# Patient Record
Sex: Female | Born: 1960 | Race: White | Hispanic: No | Smoking: Current every day smoker
Health system: Southern US, Community
[De-identification: ages and names within clinical notes are randomized; demographics above are authoritative.]

## PROBLEM LIST (undated history)

## (undated) DIAGNOSIS — I10 Essential (primary) hypertension: Secondary | ICD-10-CM

## (undated) DIAGNOSIS — M549 Dorsalgia, unspecified: Secondary | ICD-10-CM

## (undated) DIAGNOSIS — E785 Hyperlipidemia, unspecified: Secondary | ICD-10-CM

## (undated) DIAGNOSIS — F17200 Nicotine dependence, unspecified, uncomplicated: Secondary | ICD-10-CM

## (undated) DIAGNOSIS — G4719 Other hypersomnia: Secondary | ICD-10-CM

## (undated) DIAGNOSIS — I2510 Atherosclerotic heart disease of native coronary artery without angina pectoris: Secondary | ICD-10-CM

## (undated) DIAGNOSIS — I214 Non-ST elevation (NSTEMI) myocardial infarction: Secondary | ICD-10-CM

## (undated) DIAGNOSIS — J301 Allergic rhinitis due to pollen: Secondary | ICD-10-CM

## (undated) DIAGNOSIS — J441 Chronic obstructive pulmonary disease with (acute) exacerbation: Secondary | ICD-10-CM

## (undated) DIAGNOSIS — F419 Anxiety disorder, unspecified: Secondary | ICD-10-CM

## (undated) DIAGNOSIS — J40 Bronchitis, not specified as acute or chronic: Secondary | ICD-10-CM

## (undated) HISTORY — DX: Essential (primary) hypertension: I10

## (undated) HISTORY — DX: Other hypersomnia: G47.19

## (undated) HISTORY — PX: CARPAL TUNNEL RELEASE: SHX101

## (undated) HISTORY — DX: Anxiety disorder, unspecified: F41.9

## (undated) HISTORY — PX: TONSILLECTOMY: SHX5217

## (undated) HISTORY — DX: Atherosclerotic heart disease of native coronary artery without angina pectoris: I25.10

## (undated) HISTORY — PX: TUBAL LIGATION: SHX77

## (undated) HISTORY — PX: CHOLECYSTECTOMY: SHX55

## (undated) HISTORY — DX: Allergic rhinitis due to pollen: J30.1

## (undated) HISTORY — DX: Chronic obstructive pulmonary disease with (acute) exacerbation: J44.1

## (undated) HISTORY — DX: Non-ST elevation (NSTEMI) myocardial infarction: I21.4

## (undated) HISTORY — PX: LUNG BIOPSY: SHX232

## (undated) HISTORY — PX: CARDIAC CATHETERIZATION: SHX172

## (undated) HISTORY — DX: Hyperlipidemia, unspecified: E78.5

## (undated) HISTORY — DX: Dorsalgia, unspecified: M54.9

## (undated) HISTORY — DX: Bronchitis, not specified as acute or chronic: J40

## (undated) HISTORY — DX: Nicotine dependence, unspecified, uncomplicated: F17.200

---

## 1989-08-25 HISTORY — PX: LOBECTOMY: SHX5089

## 2003-03-30 ENCOUNTER — Emergency Department (HOSPITAL_COMMUNITY): Admission: EM | Admit: 2003-03-30 | Discharge: 2003-03-30 | Payer: Self-pay | Admitting: Emergency Medicine

## 2003-03-30 ENCOUNTER — Encounter: Payer: Self-pay | Admitting: Emergency Medicine

## 2003-10-20 ENCOUNTER — Ambulatory Visit (HOSPITAL_COMMUNITY): Admission: RE | Admit: 2003-10-20 | Discharge: 2003-10-20 | Payer: Self-pay | Admitting: Obstetrics & Gynecology

## 2003-12-08 ENCOUNTER — Ambulatory Visit (HOSPITAL_COMMUNITY): Admission: RE | Admit: 2003-12-08 | Discharge: 2003-12-08 | Payer: Self-pay | Admitting: Obstetrics & Gynecology

## 2004-07-23 ENCOUNTER — Emergency Department (HOSPITAL_COMMUNITY): Admission: EM | Admit: 2004-07-23 | Discharge: 2004-07-23 | Payer: Self-pay | Admitting: Emergency Medicine

## 2006-06-10 ENCOUNTER — Ambulatory Visit: Payer: Self-pay | Admitting: Orthopedic Surgery

## 2006-07-01 ENCOUNTER — Ambulatory Visit: Payer: Self-pay | Admitting: Orthopedic Surgery

## 2006-07-02 ENCOUNTER — Encounter (HOSPITAL_COMMUNITY): Admission: RE | Admit: 2006-07-02 | Discharge: 2006-08-04 | Payer: Self-pay | Admitting: Orthopedic Surgery

## 2006-07-22 ENCOUNTER — Ambulatory Visit: Payer: Self-pay | Admitting: Orthopedic Surgery

## 2006-08-06 ENCOUNTER — Ambulatory Visit: Payer: Self-pay | Admitting: Orthopedic Surgery

## 2006-09-03 ENCOUNTER — Ambulatory Visit: Payer: Self-pay | Admitting: Orthopedic Surgery

## 2006-12-07 ENCOUNTER — Emergency Department (HOSPITAL_COMMUNITY): Admission: EM | Admit: 2006-12-07 | Discharge: 2006-12-07 | Payer: Self-pay | Admitting: Emergency Medicine

## 2006-12-11 ENCOUNTER — Encounter (HOSPITAL_COMMUNITY): Admission: RE | Admit: 2006-12-11 | Discharge: 2007-01-10 | Payer: Self-pay | Admitting: General Surgery

## 2006-12-21 ENCOUNTER — Encounter (INDEPENDENT_AMBULATORY_CARE_PROVIDER_SITE_OTHER): Payer: Self-pay | Admitting: Specialist

## 2006-12-21 ENCOUNTER — Ambulatory Visit (HOSPITAL_COMMUNITY): Admission: RE | Admit: 2006-12-21 | Discharge: 2006-12-21 | Payer: Self-pay | Admitting: General Surgery

## 2007-04-15 ENCOUNTER — Ambulatory Visit (HOSPITAL_COMMUNITY): Admission: RE | Admit: 2007-04-15 | Discharge: 2007-04-15 | Payer: Self-pay | Admitting: Obstetrics & Gynecology

## 2007-11-27 ENCOUNTER — Ambulatory Visit: Payer: Self-pay | Admitting: Family Medicine

## 2007-12-13 ENCOUNTER — Ambulatory Visit (HOSPITAL_COMMUNITY): Admission: RE | Admit: 2007-12-13 | Discharge: 2007-12-13 | Payer: Self-pay | Admitting: Orthopaedic Surgery

## 2007-12-31 ENCOUNTER — Ambulatory Visit (HOSPITAL_COMMUNITY): Admission: RE | Admit: 2007-12-31 | Discharge: 2007-12-31 | Payer: Self-pay | Admitting: Orthopaedic Surgery

## 2008-10-13 ENCOUNTER — Other Ambulatory Visit: Admission: RE | Admit: 2008-10-13 | Discharge: 2008-10-13 | Payer: Self-pay | Admitting: Obstetrics & Gynecology

## 2008-12-21 ENCOUNTER — Ambulatory Visit (HOSPITAL_COMMUNITY): Admission: RE | Admit: 2008-12-21 | Discharge: 2008-12-21 | Payer: Self-pay | Admitting: Obstetrics & Gynecology

## 2009-02-28 ENCOUNTER — Emergency Department: Payer: Self-pay | Admitting: Emergency Medicine

## 2010-03-01 ENCOUNTER — Other Ambulatory Visit: Admission: RE | Admit: 2010-03-01 | Discharge: 2010-03-01 | Payer: Self-pay | Admitting: Obstetrics & Gynecology

## 2010-12-25 ENCOUNTER — Other Ambulatory Visit: Payer: Self-pay | Admitting: Obstetrics & Gynecology

## 2010-12-25 DIAGNOSIS — R52 Pain, unspecified: Secondary | ICD-10-CM

## 2010-12-25 DIAGNOSIS — N6320 Unspecified lump in the left breast, unspecified quadrant: Secondary | ICD-10-CM

## 2011-01-01 ENCOUNTER — Ambulatory Visit (HOSPITAL_COMMUNITY)
Admission: RE | Admit: 2011-01-01 | Discharge: 2011-01-01 | Disposition: A | Discharge status: Home / Self Care | Payer: 59 | Source: Ambulatory Visit | Attending: Obstetrics & Gynecology | Admitting: Obstetrics & Gynecology

## 2011-01-01 ENCOUNTER — Other Ambulatory Visit (HOSPITAL_COMMUNITY): Payer: Self-pay | Admitting: Obstetrics & Gynecology

## 2011-01-01 ENCOUNTER — Other Ambulatory Visit: Payer: Self-pay

## 2011-01-01 ENCOUNTER — Other Ambulatory Visit: Payer: Self-pay | Admitting: Obstetrics & Gynecology

## 2011-01-01 ENCOUNTER — Other Ambulatory Visit: Payer: Self-pay | Admitting: Diagnostic Radiology

## 2011-01-01 DIAGNOSIS — R52 Pain, unspecified: Secondary | ICD-10-CM

## 2011-01-01 DIAGNOSIS — N6320 Unspecified lump in the left breast, unspecified quadrant: Secondary | ICD-10-CM

## 2011-01-01 DIAGNOSIS — N63 Unspecified lump in unspecified breast: Secondary | ICD-10-CM | POA: Insufficient documentation

## 2011-01-01 LAB — HM MAMMOGRAPHY

## 2011-01-07 NOTE — Op Note (Signed)
[name], Laura Gillespie             ACCOUNT NO.:  [id number]   MEDICAL RECORD NO.:  [id number]          PATIENT TYPE:  AMB   LOCATION:  DAY                           FACILITY:  [facility]   PHYSICIAN:  J. Darreld Mclean, M.D. DATE OF BIRTH:  July 28, 1961   DATE OF PROCEDURE:  DATE OF DISCHARGE:                               OPERATIVE REPORT   PREOPERATIVE DIAGNOSIS:  Carpal tunnel syndrome, left.   POSTOPERATIVE DIAGNOSIS:  Carpal tunnel syndrome, left.   PROCEDURE:  Open release of volar carpal ligament, saline neurolysis and  epineurotomy, left medial nerve.   ANESTHESIA:  Bier block.  Please refer to anesthesia record for  tourniquet time.   SURGEON:  J. Darreld Mclean, MD   INDICATIONS:  The patient is a 50 year old female with bilateral carpal  tunnel syndrome.  She has already had release on the right, now she  desires release on the left.  She previously had positive EMGs, very  little response to night splinting, antiinflammatories, and rest.  She  desires to have a surgery.  She is familiar with the surgery having just  had it on the right several weeks ago.   DESCRIPTION OF PROCEDURE:  The patient was seen in the holding area.  The left wrist was identified as the correct surgical site.  She placed  a mark on the left wrist volar site and so did I.  She was brought to  the operating room and placed supine.  A Bier block anesthesia was given  on the left side.  She was then prepped and draped in the usual manner.  A time-out identified that [name] was the patient and the left  volar wrist was the correct surgical site.  A Lempert incision was made,  then the median nerve was identified approximately, and vessel loop  placed around the nerve.  A grooved director was then placed in the  carpal tunnel space and volar carpal ligament was incised.  It was  obviously tied and then was obviously compressed.  Saline neurolysis and  epineurotomy carried out.  Retinaculum cut  proximally.  Specimen of  volar carpal ligament was sent to pathology.  The nerve inspected, no  apparent injury.  Wound then reapproximated using 3-0 nylon in  interrupted vertical mattress manner.  Sterile dressing applied, a bulky  dressing applied.  Ace bandage was applied.  No splint was applied.  The  sheet cotton was cut dorsally prior to application of the Ace bandage.  The patient tolerated the procedure well.  I will see her in the office  approximately in 1 week.   She already has pain medicine from her previous surgery and she will  continue that as needed.  If any difficulty, she is to contact me  through the office or hospital beeper system.          ______________________________  Shela Commons. Darreld Mclean, M.D.    JWK/MEDQ  D:  12/31/2007  T:  12/31/2007  Job:  [id number]

## 2011-01-07 NOTE — H&P (Signed)
Laura Gillespie, Laura Gillespie             ACCOUNT NO.:  [id number]   MEDICAL RECORD NO.:  [id number]         PATIENT TYPE:  AMB   LOCATION:  DAY                           FACILITY:  [facility]   PHYSICIAN:  J. Darreld Mclean, M.D. DATE OF BIRTH:  1961/03/02   DATE OF ADMISSION:  DATE OF DISCHARGE:  LH                              HISTORY & PHYSICAL   CHIEF COMPLAINT:  Carpal tunnel syndrome.   The patient is a 49 year old female who I first saw in my office on  April 2 complaining of pain and tenderness from carpal tunnel syndrome.  She was originally diagnosed with bilateral carpal tunnel syndrome in  2006.  She was seen at [facility].  She did not want any  surgery done at that time.  Since that time her symptoms have gotten  much worse.  She is dropping things now with the hands more on the right  than on the left.  She has marked night paresthesias.  She has used a  brace, but does not help any more.  She tried anti-inflammatories which  no longer help.  She has tried rubs.  She would now like to consider  surgery.  She had EMG's done in [place], and I have copies of those  notes and have reviewed them.  She is now ready to proceed with surgery.   PAST HISTORY:  Positive for hypertension.  She denies heart disease,  lung disease, diabetes, rheumatic fever, cancer, ulcer disease,  circulatory problems.   ALLERGIES:  She has seasonal allergies, but has no allergy to any  medication.   MEDICATIONS:  She currently takes aspirin 81 mg a day, Benicar HCT  20/12.5 daily, Zyrtec 10 mg daily, Crestor 10 mg daily.   SOCIAL HISTORY:  She does not smoke.  She does not use alcoholic  beverages.   PRIMARY CARE PHYSICIAN:  Dr. Wonda Cheng is her family doctor.   PAST SURGICAL HISTORY:  Status post lung surgery June 12, 1990,  tonsillectomy 1968, tubal ligation July 2002 cervical ablation in 2005,  and cholecystectomy April 2008.   FAMILY HISTORY:  Hypertension, diabetes, and heart  disease run in the  family.  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   PHYSICAL EXAMINATION:  VITAL SIGNS:  BP is 118/82, pulse 72,  respirations 16, afebrile, 5 feet 3 inches, 254 pounds.  GENERAL:  The patient is alert, cooperative, oriented.  HEENT:  Negative.  NECK:  Supple.  LUNGS:  Clear to P&A.  HEART:  Regular without murmur.  ABDOMEN:  Soft, tender without masses.  EXTREMITIES:  She had a positive Phalen's, positive Nelson in both hands  right worse than the left.  Grip strengths are good.  CNS:  Otherwise, intact.  SKIN:  Intact.   IMPRESSION:  Bilateral carpal tunnel syndrome.   The patient desires surgery on the right hand.  I explained to her the  risks and imponderables of the procedure.  Her labs are pending.  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                                            ______________________________  Shela Commons. Darreld Mclean, M.D.     JWK/MEDQ  D:  12/10/2007  T:  12/10/2007  Job:  [id number]

## 2011-01-07 NOTE — H&P (Signed)
Laura Gillespie, Laura Gillespie             ACCOUNT NO.:  [id number]   MEDICAL RECORD NO.:  [id number]          PATIENT TYPE:  AMB   LOCATION:  DAY                           FACILITY:  [facility]   PHYSICIAN:  J. Darreld Mclean, M.D. DATE OF BIRTH:  17-Nov-1960   DATE OF ADMISSION:  DATE OF DISCHARGE:  LH                              HISTORY & PHYSICAL   CHIEF COMPLAINT:  Carpal tunnel syndrome, left.   HISTORY OF PRESENT ILLNESS:  The patient is a 50 year old female.  I did  a carpal tunnel release on the right on 12/13/2007.  She has done well.  She now desires a carpal tunnel release on the left.  She has had nerve  conduction studies in the past from __________ [facility] in 2006  showing bilateral carpal tunnel.  She wanted to delay the surgery until  recently.  She has done very well with the right, and would like to have  carpal tunnel surgery on the left.  She is familiar with the procedure  and knows the risks and imponderables.  The patient works in the blood  bank at the hospital and realizes that she cannot __________ with blood  or other such materials with the healing wound of her hand.  She has  tried antiinflammatory drugs and splints; all have been unsuccessful.   PAST HISTORY:  Positive for hypertension.   The patient has seasonal allergies, but no allergies to medicine.   The patient takes 81 mg aspirin daily, Benicar 20/12.5 daily, Zyrtec 10  daily, and Crestor.   She does not smoke or use alcohol.   Dr. Wonda Cheng is her family doctor.   She is post lung surgery in 1991, tonsillectomy in 1968, tubal ligation  2002, ablation 2005, cholecystectomy April 2008, and carpal tunnel on  the right, April 2009.   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  Hypertension and diabetes run in the family.   VITAL SIGNS:  Normal.  HEENT:  Negative.  NECK:  Supple.  LUNGS:  Clear to P&A.  HEART:  Regular without murmur heard.  ABDOMEN:  Soft and nontender without masses.  EXTREMITIES:  A well-healed scar, right hand.  Recent carpal tunnel  surgery.  She got a positive Phalen's and positive Tinel's on the left,  negative on the right.  Grip strengths are good.  Decreased sensation in  the median nerve distribution of left hand, normal on the right.  CNS:  Otherwise intact.  SKIN:  Intact.   IMPRESSION:  Carpal tunnel syndrome on left.  Recent carpal tunnel  surgery on the right, doing well.   PLAN:  Open carpal tunnel release on the left.  Labs pending.                                            ______________________________  Shela Commons. Darreld Mclean, M.D.     JWK/MEDQ  D:  12/28/2007  T:  12/29/2007  Job:  [id number] 

## 2011-01-07 NOTE — Op Note (Signed)
NAMECHEMEKA, FILICE             ACCOUNT NO.:  [id number]   MEDICAL RECORD NO.:  [id number]          PATIENT TYPE:  AMB   LOCATION:  DAY                           FACILITY:  [facility]   PHYSICIAN:  J. Darreld Mclean, M.D. DATE OF BIRTH:  May 10, 1961   DATE OF PROCEDURE:  12/13/2007  DATE OF DISCHARGE:                               OPERATIVE REPORT   PREOPERATIVE DIAGNOSIS:  Carpal tunnel syndrome on the right.   POSTOPERATIVE DIAGNOSIS:  Carpal tunnel syndrome on the right.   PROCEDURE:  Open release of volar carpal ligament, saline neurolysis and  epineurotomy, right median nerve.   ANESTHESIA:  Bier block.  Please refer to anesthesia record for  tourniquet time.   SURGEON:  J. Darreld Mclean, MD.   DRAINS:  None.   SPLINT:  None.   The patient is a 50 year old female who had nerve conduction velocity  studies and an EMG showing carpal tunnel syndrome bilaterally.  These  were done in [place] in 2006.  She did not want surgery at that time,  but her carpal tunnel pain has gotten worse.  She has night  paresthesias.  She is dropping things.  She like to have the procedure  done.  Risk and imponderables were explained preoperatively.  She  appeared to understand, asked appropriate questions.   DESCRIPTION OF PROCEDURE:  The patient was seen in the holding area.  The right wrist was identified as correct surgical site.  I placed a  mark on the volar site and she placed a mark.  She was brought back to  the operating room.  She was placed supine.  She was given Bier block  anesthesia.  She was then prepped and draped in the usual manner.  A  time-out identified Ms. Burnstein as the patient and the right wrist as  the correct surgical site on volar side.  Midline incision was made and  the wound was opened proximally and the median nerve was identified.  A  vessel loop placed around the nerve.  A grooved director were then  placed.  The volar carpal ligament was then incised.  The  nerve was  obviously compressed.  Saline neurolysis and epineurotomy carried out.  Specimen of volar carpal ligament was sent to pathology.  The wound was  approximated using 3-0 nylon in interrupted vertical mattress manner.  Prior to closure of the wound, the nerve inspected, no apparent injury.  Sterile dressing applied, a  bulky dressing applied with Webril.  Webril was cut dorsally.  Ace  bandage was applied loosely.  The patient tolerated procedure well.  She  was given a prescription for Vicodin ES for pain.  We will see in the  office in 1 week.  Any difficulty she is to contact the office or  hospital beeper system.           ______________________________  Shela Commons. Darreld Mclean, M.D.     JWK/MEDQ  D:  12/13/2007  T:  12/13/2007  Job:  [id number]

## 2011-01-08 ENCOUNTER — Ambulatory Visit (HOSPITAL_COMMUNITY): Payer: 59

## 2011-01-10 NOTE — Op Note (Signed)
Laura Gillespie, Laura Gillespie             ACCOUNT NO.:  [id number]   MEDICAL RECORD NO.:  [id number]          PATIENT TYPE:  AMB   LOCATION:  DAY                           FACILITY:  [facility]   PHYSICIAN:  Dalia Heading, M.D.  DATE OF BIRTH:  [birth year]   DATE OF PROCEDURE:  12/21/2006  DATE OF DISCHARGE:                               OPERATIVE REPORT   PREOPERATIVE DIAGNOSIS:  Chronic cholecystitis.   POSTOPERATIVE DIAGNOSIS:  1. Chronic cholecystitis.  2. Umbilical hernia.   PROCEDURE:  1. Laparoscopic cholecystectomy.  2. Umbilical herniorrhaphy.   SURGEON:  Dalia Heading, M.D.   ANESTHESIA:  General endotracheal.   INDICATIONS:  The patient is a 50 year old white female who presents  with biliary colic secondary to chronic cholecystitis.  The risks and  benefits of the procedure including bleeding, infection, hepatobiliary  injury and the possibility of an open procedure were fully explained to  the patient, who gave an informed consent.   PROCEDURE NOTE:  The patient was placed in the supine position.  After  induction of general endotracheal anesthesia, the abdomen was prepped  and draped using the usual sterile technique with Betadine.  Surgical  site confirmation was performed.   A supraumbilical incision was made down to the fascia.  During this  dissection, it was found that the patient had a supraumbilical hernia  with omentum attached to it.  It could not be reduced.  It was elected  to thus place the Veress needle just lateral to this.  The abdomen was  then insufflated to 16 mmHg pressure.  An 11-mm trocar was then  introduced into the abdominal cavity under direct visualization without  difficulty.  The patient was placed in reverse Trendelenburg position.  An an additional 11-mm trocar was placed in the epigastric region and 5-  mm trocars were placed in the right upper quadrant, and right flank  regions.  The liver was inspected and noted to be normal limits.   The  gallbladder was retracted superior and laterally.  The dissection was  begun around the infundibulum of the gallbladder.  The cystic duct was  first identified.  Its juncture to the infundibulum fully identified.  The Endoclips were placed proximally and distally on the cystic duct;  and cystic duct was divided.  This was likewise done to the cystic  artery.  The gallbladder then freed away from the gallbladder fossa  using Bovie electrocautery.  The gallbladder was delivered through the  epigastric trocar site using an EndoCatch bag without difficulty.  The  gallbladder fossa was inspected; and no abnormal bleeding or bile  leakage was noted.  Surgicel was then placed into the gallbladder fossa.  All fluid and air were then evacuated from the abdominal cavity prior to  removal of the trocars.  All wounds were irrigated with normal saline.  All wounds were injected with 0.5% Sensorcaine.   The umbilical hernia was then addressed.  The incarcerated omentum and  properitoneal fat was excised.  The contents were then reduced; and the  defect closed using #0 Vicryl interrupted sutures.  Likewise, the trocar  site opening was closed using #0 Vicryl interrupted sutures.  All skin  incisions were closed using staples.  Betadine ointment and dry sterile  dressings were applied.   All tape and needle counts were correct at the end of the procedure.  The patient was extubated in the operating room and went back to the  recovery room awake and in stable condition.   COMPLICATIONS:  None.   SPECIMEN:  Gallbladder.   BLOOD LOSS:  Minimal.      Dalia Heading, M.D.  Electronically Signed     MAJ/MEDQ  D:  12/21/2006  T:  12/21/2006  Job:  [id number]

## 2011-01-10 NOTE — H&P (Signed)
NAMETIWANDA, THREATS             ACCOUNT NO.:  [id number]   MEDICAL RECORD NO.:  [id number]          PATIENT TYPE:  AMB   LOCATION:                                FACILITY:  [facility]   PHYSICIAN:  Dalia Heading, M.D.  DATE OF BIRTH:  1960/11/06   DATE OF ADMISSION:  DATE OF DISCHARGE:  LH                              HISTORY & PHYSICAL   CHIEF COMPLAINT:  Chronic cholecystitis   HISTORY OF PRESENT ILLNESS:  Patient is a 50 year old white female who  is referred for evaluation and treatment of abdominal pain and nausea.  It has been present for sometime, but recently has worsened. She does  have reflux disease and bloating, some fatty food intolerances noted. No  fever, chills, or jaundice has been noted.   PAST MEDICAL HISTORY:  Includes extrinsic allergies.   PAST SURGICAL HISTORY:  Right lower lobe lobectomy for a calcified lymph  node.   CURRENT MEDICATIONS:  Zyrtec.   ALLERGIES:  MORPHINE.   REVIEW OF SYSTEMS:  Unremarkable.   PHYSICAL EXAMINATION:  GENERAL:  Patient is a well-developed, well-  nourished white female in no acute distress.  HEENT:  Examination reveals no scleral icterus.  LUNGS:  Clear to auscultation with equal breath sounds bilaterally.  HEART:  Examination reveals a regular, rate, and rhythm without S3, S4  murmurs.  ABDOMEN:  Soft and nondistended. She is tender in the right upper  quadrant and epigastrium to palpation. No hepatosplenomegaly, masses, or  hernias are identified.   Ultrasound of the gallbladder is negative. HIDA scan reveals chronic  cholecystitis with a low gallbladder ejection fraction and reproducible  symptoms with a fatty meal.   IMPRESSION:  Chronic cholecystitis.   PLAN:  I will have the patient scheduled for laparoscopic  cholecystectomy on 12/21/2006. The risks and benefits of the procedure  including bleeding, infection, hepatobiliary injury, the possibility of  an open procedure were fully explained to the patient, gave  informed  consent and short stay at [facility].      Dalia Heading, M.D.  Electronically Signed     MAJ/MEDQ  D:  12/15/2006  T:  12/15/2006  Job:  [id number]

## 2011-01-10 NOTE — Op Note (Signed)
NAME:  Laura Gillespie, Laura Gillespie                       ACCOUNT NO.:  [id number]   MEDICAL RECORD NO.:  [id number]                   PATIENT TYPE:  AMB   LOCATION:  DAY                                  FACILITY:  [facility]   PHYSICIAN:  Lazaro Arms, M.D.                DATE OF BIRTH:  August 16, 1961   DATE OF PROCEDURE:  12/08/2003  DATE OF DISCHARGE:                                 OPERATIVE REPORT   PREOPERATIVE DIAGNOSES:  1. Menometrorrhagia.  2. Dysmenorrhea.   POSTOPERATIVE DIAGNOSES:  1. Menometrorrhagia.  2. Dysmenorrhea.   PROCEDURE:  Hysteroscopy, D&C, endometrial ablation.   SURGEON:  Lazaro Arms, M.D.   ANESTHESIA:  General endotracheal.   FINDINGS:  The patient had a normal endometrial cavity.   DESCRIPTION OF PROCEDURE:  The patient was taken to the operating room and  placed in the supine position where she underwent general endotracheal  anesthesia.  She was placed in the dorsal lithotomy position and prepped and  draped in the usual sterile fashion.  Her bladder was drained.   A Graves speculum was placed.  Her cervix was grasped.  A paracervical block  using 0.5% Marcaine was placed bilaterally, 20 cc total.  The cervix was  dilated serially to allow passage of the hysteroscope.  The hysteroscopic  evaluation was completely normal, with no polyps and no abnormalities  whatsoever.  An endometrial ablation was performed with the ThermaChoice 3.  A total of 21 cc of D5W was required.  It was heated to 87 degrees Celsius  for a total therapy time of 10 minutes and 3 seconds.  All fluid was  recovered at the end of the procedure.   The patient tolerated the procedure well.  She experienced minimal blood  loss.  She was taken to the recovery room in good and stable condition.  All  counts were correct.  Specimens went to the lab.      ___________________________________________                                            Lazaro Arms, M.D.   LHE/MEDQ  D:  12/08/2003   T:  12/08/2003  Job:  [id number]

## 2011-05-19 ENCOUNTER — Ambulatory Visit: Payer: Self-pay

## 2011-05-20 LAB — COMPREHENSIVE METABOLIC PANEL
ALT: 20
AST: 20
Albumin: 3.9
Alkaline Phosphatase: 98
BUN: 15
CO2: 29
Calcium: 9.4
Chloride: 102
Creatinine, Ser: 0.98
GFR calc Af Amer: >60
GFR calc non Af Amer: >60
Glucose, Bld: 109 — ABNORMAL HIGH
Potassium: 4.1
Sodium: 138
Total Bilirubin: 0.4
Total Protein: 6.5

## 2011-05-20 LAB — URINALYSIS, ROUTINE W REFLEX MICROSCOPIC
Bilirubin Urine: NEGATIVE
Glucose, UA: NEGATIVE
Ketones, ur: NEGATIVE
pH: 6.5

## 2011-05-20 LAB — DIFFERENTIAL
Basophils Absolute: 0
Eosinophils Relative: 3
Lymphocytes Relative: 34
Lymphs Abs: 1.5
Monocytes Absolute: 0.5

## 2011-05-20 LAB — CBC
HCT: 31.9 — ABNORMAL LOW
Hemoglobin: 11.3 — ABNORMAL LOW
MCHC: 35.4
MCV: 83.2
Platelets: 299
RBC: 3.84 — ABNORMAL LOW
RDW: 14.2
WBC: 4.5

## 2011-05-29 ENCOUNTER — Encounter (INDEPENDENT_AMBULATORY_CARE_PROVIDER_SITE_OTHER): Payer: Self-pay | Admitting: General Surgery

## 2011-05-30 ENCOUNTER — Ambulatory Visit (INDEPENDENT_AMBULATORY_CARE_PROVIDER_SITE_OTHER): Payer: 59 | Admitting: General Surgery

## 2011-06-06 ENCOUNTER — Encounter (INDEPENDENT_AMBULATORY_CARE_PROVIDER_SITE_OTHER): Payer: Self-pay | Admitting: General Surgery

## 2011-12-22 ENCOUNTER — Other Ambulatory Visit (HOSPITAL_COMMUNITY)
Admission: RE | Admit: 2011-12-22 | Discharge: 2011-12-22 | Disposition: A | Discharge status: Home / Self Care | Payer: 59 | Source: Ambulatory Visit | Attending: Obstetrics & Gynecology | Admitting: Obstetrics & Gynecology

## 2011-12-23 ENCOUNTER — Other Ambulatory Visit: Payer: Self-pay | Admitting: Obstetrics & Gynecology

## 2011-12-23 DIAGNOSIS — Z01419 Encounter for gynecological examination (general) (routine) without abnormal findings: Secondary | ICD-10-CM | POA: Insufficient documentation

## 2013-02-21 ENCOUNTER — Other Ambulatory Visit: Payer: Self-pay

## 2013-02-21 DIAGNOSIS — R0602 Shortness of breath: Secondary | ICD-10-CM

## 2013-02-22 ENCOUNTER — Ambulatory Visit (HOSPITAL_COMMUNITY)
Admission: RE | Admit: 2013-02-22 | Discharge: 2013-02-22 | Disposition: A | Discharge status: Home / Self Care | Payer: 59 | Source: Ambulatory Visit | Attending: Pulmonary Disease | Admitting: Pulmonary Disease

## 2013-02-22 ENCOUNTER — Other Ambulatory Visit (HOSPITAL_COMMUNITY): Payer: Self-pay | Admitting: Pulmonary Disease

## 2013-02-22 DIAGNOSIS — Z9889 Other specified postprocedural states: Secondary | ICD-10-CM

## 2013-02-22 DIAGNOSIS — Z09 Encounter for follow-up examination after completed treatment for conditions other than malignant neoplasm: Secondary | ICD-10-CM | POA: Insufficient documentation

## 2013-02-23 ENCOUNTER — Encounter (HOSPITAL_COMMUNITY): Payer: 59

## 2013-05-13 ENCOUNTER — Ambulatory Visit: Payer: Self-pay | Admitting: Internal Medicine

## 2013-10-06 ENCOUNTER — Encounter (INDEPENDENT_AMBULATORY_CARE_PROVIDER_SITE_OTHER): Payer: Self-pay | Admitting: *Deleted

## 2013-11-03 ENCOUNTER — Other Ambulatory Visit (INDEPENDENT_AMBULATORY_CARE_PROVIDER_SITE_OTHER): Payer: Self-pay | Admitting: *Deleted

## 2013-11-03 DIAGNOSIS — Z1211 Encounter for screening for malignant neoplasm of colon: Secondary | ICD-10-CM

## 2013-11-07 ENCOUNTER — Encounter (INDEPENDENT_AMBULATORY_CARE_PROVIDER_SITE_OTHER): Payer: Self-pay | Admitting: *Deleted

## 2013-11-07 ENCOUNTER — Telehealth (INDEPENDENT_AMBULATORY_CARE_PROVIDER_SITE_OTHER): Payer: Self-pay | Admitting: *Deleted

## 2013-11-07 DIAGNOSIS — Z1211 Encounter for screening for malignant neoplasm of colon: Secondary | ICD-10-CM

## 2013-11-07 NOTE — Telephone Encounter (Signed)
Patient needs movi prep 

## 2013-11-08 MED ORDER — PEG-KCL-NACL-NASULF-NA ASC-C 100 G PO SOLR: 1.0000 | Freq: Once | ORAL | Status: DC

## 2013-12-13 ENCOUNTER — Telehealth (INDEPENDENT_AMBULATORY_CARE_PROVIDER_SITE_OTHER): Payer: Self-pay | Admitting: *Deleted

## 2013-12-13 NOTE — Telephone Encounter (Signed)
agree

## 2013-12-13 NOTE — Telephone Encounter (Signed)
  Procedure: tcs  Reason/Indication:  screening  Has patient had this procedure before?  no  If so, when, by whom and where?    Is there a family history of colon cancer?  no  Who?  What age when diagnosed?    Is patient diabetic?   no      Does patient have prosthetic heart valve?  no  Do you have a pacemaker?  no  Has patient ever had endocarditis? no  Has patient had joint replacement within last 12 months?  no  Does patient tend to be constipated or take laxatives? no  Is patient on Coumadin, Plavix and/or Aspirin? no  Medications: ibuprofen prn, dyazide 37.5/25 mg daily  Allergies: nkda  Medication Adjustment:   Procedure date & time: 01/12/14

## 2013-12-23 DIAGNOSIS — I2510 Atherosclerotic heart disease of native coronary artery without angina pectoris: Secondary | ICD-10-CM

## 2013-12-23 HISTORY — DX: Atherosclerotic heart disease of native coronary artery without angina pectoris: I25.10

## 2013-12-23 HISTORY — PX: CORONARY ANGIOPLASTY: SHX604

## 2014-01-02 ENCOUNTER — Encounter (HOSPITAL_COMMUNITY): Payer: Self-pay | Admitting: Pharmacy Technician

## 2014-01-06 ENCOUNTER — Ambulatory Visit: Payer: Self-pay

## 2014-01-06 ENCOUNTER — Inpatient Hospital Stay: Payer: Self-pay | Admitting: Internal Medicine

## 2014-01-06 LAB — URINALYSIS, COMPLETE
BACTERIA: NONE SEEN
Bilirubin,UR: NEGATIVE
Blood: NEGATIVE
Glucose,UR: NEGATIVE mg/dL (ref 0–75)
Ketone: NEGATIVE
LEUKOCYTE ESTERASE: NEGATIVE
NITRITE: NEGATIVE
PH: 6 (ref 4.5–8.0)
Protein: NEGATIVE
RBC, UR: NONE SEEN /HPF (ref 0–5)
Specific Gravity: 1.005 (ref 1.003–1.030)
Squamous Epithelial: 1

## 2014-01-06 LAB — COMPREHENSIVE METABOLIC PANEL
ALT: 21 U/L (ref 12–78)
Albumin: 4 g/dL (ref 3.4–5.0)
Alkaline Phosphatase: 164 U/L — ABNORMAL HIGH
Anion Gap: 5 — ABNORMAL LOW (ref 7–16)
BUN: 9 mg/dL (ref 7–18)
Bilirubin,Total: 0.2 mg/dL (ref 0.2–1.0)
CALCIUM: 9.6 mg/dL (ref 8.5–10.1)
CO2: 28 mmol/L (ref 21–32)
Chloride: 107 mmol/L (ref 98–107)
Creatinine: 0.9 mg/dL (ref 0.60–1.30)
EGFR (African American): >60
EGFR (Non-African Amer.): >60
Glucose: 90 mg/dL (ref 65–99)
OSMOLALITY: 278 (ref 275–301)
POTASSIUM: 3.9 mmol/L (ref 3.5–5.1)
SGOT(AST): 27 U/L (ref 15–37)
Sodium: 140 mmol/L (ref 136–145)
Total Protein: 7.5 g/dL (ref 6.4–8.2)

## 2014-01-06 LAB — CBC
HCT: 40.4 % (ref 35.0–47.0)
HGB: 13.4 g/dL (ref 12.0–16.0)
MCH: 28 pg (ref 26.0–34.0)
MCHC: 33.2 g/dL (ref 32.0–36.0)
MCV: 84 fL (ref 80–100)
Platelet: 296 10*3/uL (ref 150–440)
RBC: 4.8 10*6/uL (ref 3.80–5.20)
RDW: 13.7 % (ref 11.5–14.5)
WBC: 7.6 10*3/uL (ref 3.6–11.0)

## 2014-01-06 LAB — TROPONIN I
TROPONIN-I: 0.65 ng/mL — AB
Troponin-I: 0.58 ng/mL — ABNORMAL HIGH

## 2014-01-06 LAB — CK-MB
CK-MB: 2.6 ng/mL (ref 0.5–3.6)
CK-MB: 2.3 ng/mL (ref 0.5–3.6)

## 2014-01-06 LAB — APTT: ACTIVATED PTT: 33.4 s (ref 23.6–35.9)

## 2014-01-07 DIAGNOSIS — E785 Hyperlipidemia, unspecified: Secondary | ICD-10-CM

## 2014-01-07 DIAGNOSIS — I200 Unstable angina: Secondary | ICD-10-CM

## 2014-01-07 LAB — LIPID PANEL
Cholesterol: 259 mg/dL — ABNORMAL HIGH (ref 0–200)
HDL: 45 mg/dL (ref 40–60)
Ldl Cholesterol, Calc: 171 mg/dL — ABNORMAL HIGH (ref 0–100)
Triglycerides: 215 mg/dL — ABNORMAL HIGH (ref 0–200)
VLDL Cholesterol, Calc: 43 mg/dL — ABNORMAL HIGH (ref 5–40)

## 2014-01-07 LAB — CK-MB: CK-MB: 2 ng/mL (ref 0.5–3.6)

## 2014-01-07 LAB — APTT: Activated PTT: 72.2 secs — ABNORMAL HIGH (ref 23.6–35.9)

## 2014-01-07 LAB — TROPONIN I: Troponin-I: 0.5 ng/mL — ABNORMAL HIGH

## 2014-01-07 LAB — PLATELET COUNT: Platelet: 287 10*3/uL (ref 150–440)

## 2014-01-08 LAB — HEMOGLOBIN: HGB: 13.3 g/dL (ref 12.0–16.0)

## 2014-01-08 LAB — APTT
Activated PTT: 63.3 secs — ABNORMAL HIGH (ref 23.6–35.9)
Activated PTT: 63.3 secs — ABNORMAL HIGH (ref 23.6–35.9)
Activated PTT: 69.7 secs — ABNORMAL HIGH (ref 23.6–35.9)

## 2014-01-08 LAB — PLATELET COUNT: PLATELETS: 275 10*3/uL (ref 150–440)

## 2014-01-09 ENCOUNTER — Encounter: Payer: Self-pay | Admitting: Cardiovascular Disease

## 2014-01-09 DIAGNOSIS — I2510 Atherosclerotic heart disease of native coronary artery without angina pectoris: Secondary | ICD-10-CM

## 2014-01-09 DIAGNOSIS — I214 Non-ST elevation (NSTEMI) myocardial infarction: Secondary | ICD-10-CM

## 2014-01-09 DIAGNOSIS — F17200 Nicotine dependence, unspecified, uncomplicated: Secondary | ICD-10-CM

## 2014-01-09 LAB — BASIC METABOLIC PANEL
Anion Gap: 9 (ref 7–16)
BUN: 15 mg/dL (ref 7–18)
CHLORIDE: 107 mmol/L (ref 98–107)
Calcium, Total: 9.3 mg/dL (ref 8.5–10.1)
Co2: 23 mmol/L (ref 21–32)
Creatinine: 0.8 mg/dL (ref 0.60–1.30)
EGFR (African American): >60
EGFR (Non-African Amer.): >60
Glucose: 100 mg/dL — ABNORMAL HIGH (ref 65–99)
Osmolality: 278 (ref 275–301)
Potassium: 4.4 mmol/L (ref 3.5–5.1)
Sodium: 139 mmol/L (ref 136–145)

## 2014-01-09 LAB — CK TOTAL AND CKMB (NOT AT ARMC)
CK, Total: 67 U/L
CK-MB: 0.9 ng/mL (ref 0.5–3.6)

## 2014-01-09 LAB — APTT: ACTIVATED PTT: 93.3 s — AB (ref 23.6–35.9)

## 2014-01-10 LAB — BASIC METABOLIC PANEL
Anion Gap: 6 — ABNORMAL LOW (ref 7–16)
BUN: 13 mg/dL (ref 7–18)
CALCIUM: 9.2 mg/dL (ref 8.5–10.1)
CHLORIDE: 109 mmol/L — AB (ref 98–107)
Co2: 24 mmol/L (ref 21–32)
Creatinine: 0.58 mg/dL — ABNORMAL LOW (ref 0.60–1.30)
EGFR (African American): >60
EGFR (Non-African Amer.): >60
Glucose: 95 mg/dL (ref 65–99)
Osmolality: 277 (ref 275–301)
POTASSIUM: 4.6 mmol/L (ref 3.5–5.1)
SODIUM: 139 mmol/L (ref 136–145)

## 2014-01-12 ENCOUNTER — Ambulatory Visit (HOSPITAL_COMMUNITY): Admission: RE | Admit: 2014-01-12 | Payer: 59 | Source: Ambulatory Visit | Admitting: Internal Medicine

## 2014-01-12 ENCOUNTER — Telehealth: Payer: Self-pay

## 2014-01-12 ENCOUNTER — Encounter (HOSPITAL_COMMUNITY): Admission: RE | Payer: Self-pay | Source: Ambulatory Visit

## 2014-01-12 SURGERY — COLONOSCOPY
Anesthesia: Moderate Sedation

## 2014-01-12 NOTE — Telephone Encounter (Signed)
Attempted to contact pt regarding discharge from [facility] on 01/10/14. Left message for pt to call back.

## 2014-01-13 NOTE — Telephone Encounter (Signed)
Patient contacted regarding discharge from [facility] on 01/10/14.  Patient understands to follow up with Dr. Rockey Situ on 01/17/14 at 11:30 at [facility]. Patient understands discharge instructions? yes Patient understands medications and regiment? yes Patient understands to bring all medications to this visit? yes  Pt did not pick up samples after d/c, she will p/u at her ov.

## 2014-01-17 ENCOUNTER — Ambulatory Visit (INDEPENDENT_AMBULATORY_CARE_PROVIDER_SITE_OTHER): Payer: 59 | Admitting: Cardiovascular Disease

## 2014-01-17 ENCOUNTER — Encounter: Payer: Self-pay | Admitting: Cardiovascular Disease

## 2014-01-17 VITALS — BP 110/70 | HR 66 | Ht 63.0 in | Wt 236.5 lb

## 2014-01-17 DIAGNOSIS — J069 Acute upper respiratory infection, unspecified: Secondary | ICD-10-CM | POA: Insufficient documentation

## 2014-01-17 DIAGNOSIS — F17200 Nicotine dependence, unspecified, uncomplicated: Secondary | ICD-10-CM | POA: Insufficient documentation

## 2014-01-17 DIAGNOSIS — E785 Hyperlipidemia, unspecified: Secondary | ICD-10-CM | POA: Insufficient documentation

## 2014-01-17 DIAGNOSIS — I2510 Atherosclerotic heart disease of native coronary artery without angina pectoris: Secondary | ICD-10-CM | POA: Insufficient documentation

## 2014-01-17 DIAGNOSIS — R0602 Shortness of breath: Secondary | ICD-10-CM

## 2014-01-17 DIAGNOSIS — Z955 Presence of coronary angioplasty implant and graft: Secondary | ICD-10-CM

## 2014-01-17 DIAGNOSIS — I214 Non-ST elevation (NSTEMI) myocardial infarction: Secondary | ICD-10-CM | POA: Insufficient documentation

## 2014-01-17 NOTE — Assessment & Plan Note (Signed)
Doing well  following her recent stent placement. Recommended she continue on her aspirin and brilinta twice a day

## 2014-01-17 NOTE — Assessment & Plan Note (Signed)
Encouraged her to stay on the Crestor 20 mg daily. Goal LDL less than 70

## 2014-01-17 NOTE — Patient Instructions (Signed)
You are doing well. No medication changes were made.  Please call us if you have new issues that need to be addressed before your next appt.  Your physician wants you to follow-up in: 6 months.  You will receive a reminder letter in the mail two months in advance. If you don't receive a letter, please call our office to schedule the follow-up appointment.   

## 2014-01-17 NOTE — Assessment & Plan Note (Signed)
she has recovered well from her recent non-STEMI. This was secondary to severe distal RCA disease.

## 2014-01-17 NOTE — Assessment & Plan Note (Signed)
Second day of URI. Suggested medical management at this time. No need for antibiotics given that symptoms are likely secondary to viral etiology. She has sore throat, nasal congestion, cough

## 2014-01-17 NOTE — Assessment & Plan Note (Signed)
No significant disease noted other than her distal RCA.

## 2014-01-17 NOTE — Progress Notes (Signed)
Patient ID: Laura Gillespie, female    DOB: 1960/08/31, 53 y.o.   MRN: [id number]  HPI Comments: Laura Gillespie is a very pleasant 53 year old woman with long history of smoking, obesity, hyperlipidemia, strong family history of CAD who presented to the hospital 01/07/2014 with chest pain, angina, non-STEMI. She had elevated cardiac enzymes with troponin 0.65. Cardiac catheterization was performed that showed severe distal RCA disease. Bare-metal stent was placed with improvement of her symptoms  She presents today for followup and to establish care in the [facility]. Since her discharge, she denies having any significant chest pain. She has not been smoking and circumflex hospital admission. She does report contracting a upper respiratory infection. She has severe sore throat, sinus congestion, cough. She's had this for 2 days now. She does not feel that she can go back to work this week given her symptoms.  She has otherwise been active.   EKG shows normal sinus rhythm with rate 66 beats per minute, left axis deviation  Echocardiogram in the hospital was essentially normal  Cardiac catheterization showed 99% distal RCA disease, down to 0% residual disease following the stent placement      Outpatient Encounter Prescriptions as of 01/17/2014  Medication Sig  . ALPRAZolam (XANAX) 0.5 MG tablet Take 0.5 mg by mouth at bedtime.   Marland Kitchen BRILINTA 90 MG TABS tablet Take 90 mg by mouth 2 (two) times daily.   . CRESTOR 20 MG tablet Take 20 mg by mouth daily.   . metoprolol tartrate (LOPRESSOR) 25 MG tablet Take 25 mg by mouth 2 (two) times daily.   Marland Kitchen NITROSTAT 0.4 MG SL tablet Place 0.4 mg under the tongue every 5 (five) minutes as needed.   . traMADol (ULTRAM) 50 MG tablet Take 50 mg by mouth every 6 (six) hours as needed.   Marland Kitchen aspirin EC 81 MG tablet Take 1 tablet (81 mg total) by mouth daily.    Review of Systems  Constitutional: Negative.        Malaise, sore throat  HENT: Negative.    Eyes: Negative.   Respiratory: Positive for cough.   Cardiovascular: Negative.   Gastrointestinal: Negative.   Endocrine: Negative.   Musculoskeletal: Negative.   Skin: Negative.   Allergic/Immunologic: Negative.   Neurological: Negative.   Hematological: Negative.   Psychiatric/Behavioral: Negative.   All other systems reviewed and are negative.   BP 110/70  Pulse 66  Ht 5\' 3"  (1.6 m)  Wt 236 lb 8 oz (107.276 kg)  BMI 41.90 kg/m2  Physical Exam  Nursing note and vitals reviewed. Constitutional: She is oriented to person, place, and time. She appears well-developed and well-nourished.  HENT:  Head: Normocephalic.  Nose: Nose normal.  Mouth/Throat: Oropharynx is clear and moist.  Eyes: Conjunctivae are normal. Pupils are equal, round, and reactive to light.  Neck: Normal range of motion. Neck supple. No JVD present.  Cardiovascular: Normal rate, regular rhythm, S1 normal, S2 normal, normal heart sounds and intact distal pulses.  Exam reveals no gallop and no friction rub.   No murmur heard. Pulmonary/Chest: Effort normal and breath sounds normal. No respiratory distress. She has no wheezes. She has no rales. She exhibits no tenderness.  Abdominal: Soft. Bowel sounds are normal. She exhibits no distension. There is no tenderness.  Musculoskeletal: Normal range of motion. She exhibits no edema and no tenderness.  Lymphadenopathy:    She has no cervical adenopathy.  Neurological: She is alert and oriented to person, place, and time.  Coordination normal.  Skin: Skin is warm and dry. No rash noted. No erythema.  Psychiatric: She has a normal mood and affect. Her behavior is normal. Judgment and thought content normal.    Assessment and Plan

## 2014-02-08 DIAGNOSIS — Z9861 Coronary angioplasty status: Secondary | ICD-10-CM

## 2014-02-08 DIAGNOSIS — F17200 Nicotine dependence, unspecified, uncomplicated: Secondary | ICD-10-CM

## 2014-02-08 DIAGNOSIS — E785 Hyperlipidemia, unspecified: Secondary | ICD-10-CM

## 2014-02-08 DIAGNOSIS — J069 Acute upper respiratory infection, unspecified: Secondary | ICD-10-CM

## 2014-02-08 DIAGNOSIS — I214 Non-ST elevation (NSTEMI) myocardial infarction: Secondary | ICD-10-CM

## 2014-02-08 DIAGNOSIS — I2510 Atherosclerotic heart disease of native coronary artery without angina pectoris: Secondary | ICD-10-CM

## 2014-02-09 ENCOUNTER — Encounter (HOSPITAL_COMMUNITY): Payer: Self-pay | Admitting: Emergency Medicine

## 2014-02-09 ENCOUNTER — Emergency Department (HOSPITAL_COMMUNITY): Payer: 59

## 2014-02-09 ENCOUNTER — Emergency Department (HOSPITAL_COMMUNITY)
Admission: EM | Admit: 2014-02-09 | Discharge: 2014-02-09 | Disposition: A | Discharge status: Home / Self Care | Payer: 59 | Attending: Emergency Medicine | Admitting: Emergency Medicine

## 2014-02-09 DIAGNOSIS — Z79899 Other long term (current) drug therapy: Secondary | ICD-10-CM | POA: Insufficient documentation

## 2014-02-09 DIAGNOSIS — I252 Old myocardial infarction: Secondary | ICD-10-CM | POA: Insufficient documentation

## 2014-02-09 DIAGNOSIS — Z7902 Long term (current) use of antithrombotics/antiplatelets: Secondary | ICD-10-CM | POA: Insufficient documentation

## 2014-02-09 DIAGNOSIS — Z862 Personal history of diseases of the blood and blood-forming organs and certain disorders involving the immune mechanism: Secondary | ICD-10-CM | POA: Insufficient documentation

## 2014-02-09 DIAGNOSIS — Z7982 Long term (current) use of aspirin: Secondary | ICD-10-CM | POA: Insufficient documentation

## 2014-02-09 DIAGNOSIS — I2510 Atherosclerotic heart disease of native coronary artery without angina pectoris: Secondary | ICD-10-CM | POA: Insufficient documentation

## 2014-02-09 DIAGNOSIS — Z9861 Coronary angioplasty status: Secondary | ICD-10-CM | POA: Insufficient documentation

## 2014-02-09 DIAGNOSIS — I10 Essential (primary) hypertension: Secondary | ICD-10-CM | POA: Insufficient documentation

## 2014-02-09 DIAGNOSIS — Z87891 Personal history of nicotine dependence: Secondary | ICD-10-CM | POA: Insufficient documentation

## 2014-02-09 DIAGNOSIS — R079 Chest pain, unspecified: Secondary | ICD-10-CM | POA: Insufficient documentation

## 2014-02-09 DIAGNOSIS — Z8639 Personal history of other endocrine, nutritional and metabolic disease: Secondary | ICD-10-CM | POA: Insufficient documentation

## 2014-02-09 DIAGNOSIS — Z9889 Other specified postprocedural states: Secondary | ICD-10-CM | POA: Insufficient documentation

## 2014-02-09 LAB — COMPREHENSIVE METABOLIC PANEL
ALT: 19 U/L (ref 0–35)
AST: 19 U/L (ref 0–37)
Albumin: 3.5 g/dL (ref 3.5–5.2)
Alkaline Phosphatase: 126 U/L — ABNORMAL HIGH (ref 39–117)
BILIRUBIN TOTAL: 0.3 mg/dL (ref 0.3–1.2)
BUN: 13 mg/dL (ref 6–23)
CALCIUM: 9.1 mg/dL (ref 8.4–10.5)
CHLORIDE: 102 meq/L (ref 96–112)
CO2: 21 meq/L (ref 19–32)
Creatinine, Ser: 0.73 mg/dL (ref 0.50–1.10)
GFR calc Af Amer: >90 mL/min (ref >90)
GFR calc non Af Amer: >90 mL/min (ref >90)
Glucose, Bld: 144 mg/dL — ABNORMAL HIGH (ref 70–99)
Potassium: 3.9 mEq/L (ref 3.7–5.3)
SODIUM: 138 meq/L (ref 137–147)
Total Protein: 6.8 g/dL (ref 6.0–8.3)

## 2014-02-09 LAB — CBC
HCT: 36 % (ref 36.0–46.0)
Hemoglobin: 12 g/dL (ref 12.0–15.0)
MCH: 27.8 pg (ref 26.0–34.0)
MCHC: 33.3 g/dL (ref 30.0–36.0)
MCV: 83.3 fL (ref 78.0–100.0)
PLATELETS: 252 10*3/uL (ref 150–400)
RBC: 4.32 MIL/uL (ref 3.87–5.11)
RDW: 13.4 % (ref 11.5–15.5)
WBC: 6.3 10*3/uL (ref 4.0–10.5)

## 2014-02-09 LAB — PRO B NATRIURETIC PEPTIDE: Pro B Natriuretic peptide (BNP): 12.1 pg/mL (ref 0–125)

## 2014-02-09 LAB — PROTIME-INR
INR: 0.9 (ref 0.00–1.49)
PROTHROMBIN TIME: 12 s (ref 11.6–15.2)

## 2014-02-09 LAB — MAGNESIUM: Magnesium: 2 mg/dL (ref 1.5–2.5)

## 2014-02-09 LAB — TROPONIN I: Troponin I: <0.3 ng/mL (ref <0.30)

## 2014-02-09 MED ORDER — SODIUM CHLORIDE 0.9 % IV SOLN
1000.0000 mL | INTRAVENOUS | Status: DC
  Administered 2014-02-09: 1000 mL via INTRAVENOUS

## 2014-02-09 MED ORDER — ASPIRIN 81 MG PO CHEW
324.0000 mg | CHEWABLE_TABLET | Freq: Once | ORAL | Status: AC
  Administered 2014-02-09: 324 mg via ORAL
  Filled 2014-02-09: qty 4

## 2014-02-09 MED ORDER — FENTANYL CITRATE 0.05 MG/ML IJ SOLN: 50.0000 ug | INTRAMUSCULAR | Status: DC | PRN

## 2014-02-09 NOTE — ED Notes (Signed)
Pt denies any chest pain at this time.  States her throat feels funny and she has a headache.

## 2014-02-09 NOTE — Discharge Instructions (Signed)
As discussed, your evaluation today has been largely reassuring.  But, it is important that you monitor your condition carefully, and do not hesitate to return to the ED if you develop new, or concerning changes in your condition.  Otherwise, please follow-up with your physicians for appropriate ongoing care.   Chest Pain (Nonspecific) It is often hard to give a specific diagnosis for the cause of chest pain. There is always a chance that your pain could be related to something serious, such as a heart attack or a blood clot in the lungs. You need to follow up with your health care provider for further evaluation. CAUSES   Heartburn.  Pneumonia or bronchitis.  Anxiety or stress.  Inflammation around your heart (pericarditis) or lung (pleuritis or pleurisy).  A blood clot in the lung.  A collapsed lung (pneumothorax). It can develop suddenly on its own (spontaneous pneumothorax) or from trauma to the chest.  Shingles infection (herpes zoster virus). The chest wall is composed of bones, muscles, and cartilage. Any of these can be the source of the pain.  The bones can be bruised by injury.  The muscles or cartilage can be strained by coughing or overwork.  The cartilage can be affected by inflammation and become sore (costochondritis). DIAGNOSIS  Lab tests or other studies may be needed to find the cause of your pain. Your health care provider may have you take a test called an ambulatory electrocardiogram (ECG). An ECG records your heartbeat patterns over a 24-hour period. You may also have other tests, such as:  Transthoracic echocardiogram (TTE). During echocardiography, sound waves are used to evaluate how blood flows through your heart.  Transesophageal echocardiogram (TEE).  Cardiac monitoring. This allows your health care provider to monitor your heart rate and rhythm in real time.  Holter monitor. This is a portable device that records your heartbeat and can help diagnose  heart arrhythmias. It allows your health care provider to track your heart activity for several days, if needed.  Stress tests by exercise or by giving medicine that makes the heart beat faster. TREATMENT   Treatment depends on what may be causing your chest pain. Treatment may include:  Acid blockers for heartburn.  Anti-inflammatory medicine.  Pain medicine for inflammatory conditions.  Antibiotics if an infection is present.  You may be advised to change lifestyle habits. This includes stopping smoking and avoiding alcohol, caffeine, and chocolate.  You may be advised to keep your head raised (elevated) when sleeping. This reduces the chance of acid going backward from your stomach into your esophagus. Most of the time, nonspecific chest pain will improve within 2-3 days with rest and mild pain medicine.  HOME CARE INSTRUCTIONS   If antibiotics were prescribed, take them as directed. Finish them even if you start to feel better.  For the next few days, avoid physical activities that bring on chest pain. Continue physical activities as directed.  Do not use any tobacco products, including cigarettes, chewing tobacco, or electronic cigarettes.  Avoid drinking alcohol.  Only take medicine as directed by your health care provider.  Follow your health care provider's suggestions for further testing if your chest pain does not go away.  Keep any follow-up appointments you made. If you do not go to an appointment, you could develop lasting (chronic) problems with pain. If there is any problem keeping an appointment, call to reschedule. SEEK MEDICAL CARE IF:   Your chest pain does not go away, even after treatment.  You  have a rash with blisters on your chest.  You have a fever. SEEK IMMEDIATE MEDICAL CARE IF:   You have increased chest pain or pain that spreads to your arm, neck, jaw, back, or abdomen.  You have shortness of breath.  You have an increasing cough, or you  cough up blood.  You have severe back or abdominal pain.  You feel nauseous or vomit.  You have severe weakness.  You faint.  You have chills. This is an emergency. Do not wait to see if the pain will go away. Get medical help at once. Call your local emergency services (911 in [place] Do not drive yourself to the hospital. MAKE SURE YOU:   Understand these instructions.  Will watch your condition.  Will get help right away if you are not doing well or get worse. Document Released: 05/21/2005 Document Revised: 08/16/2013 Document Reviewed: 03/16/2008 [facility] Patient Information 2015 [place], [place]. This information is not intended to replace advice given to you by your health care provider. Make sure you discuss any questions you have with your health care provider.

## 2014-02-09 NOTE — ED Notes (Signed)
Patient c/o left chest pain that radiates under left arm. Patient reports recently having 99% block with cath and stent in May 2015. Patient reports taking 2 SL nitro with no relief.

## 2014-02-09 NOTE — ED Provider Notes (Signed)
CSN: [id number]     Arrival date & time 02/09/14  0940 History  This chart was scribed for Laura Muskrat, MD by Ludger Nutting, ED Scribe. This patient was seen in room APA04/APA04 and the patient's care was started 9:46 AM.    Chief Complaint  Patient presents with  . Chest Pain      The history is provided by the patient. No language interpreter was used.    HPI Comments: Laura Gillespie is a 53 y.o. female who presents to the Emergency Department complaining of constant left sided chest and axilla pain with radiation down the left arm beginning 15-20 minutes ago. She describes the pain as sharp, stabbing and rates it as 9/10 currently. Patient states she was seen in May 2015 and had a 99% blockage with cardiac catheterization and cardiac stent. 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 She has taken 2 SL nitro with only mild relief. She denies abdominal pain, nausea, vomiting, SOB.   Past Medical History  Diagnosis Date  . Hypertension   . Hyperlipidemia   . Acute non-ST segment elevation myocardial infarction   . Coronary artery disease 12/2013    s/p bare metal stent to distal RCA   Past Surgical History  Procedure Laterality Date  . Cholecystectomy    . Carpal tunnel release      both hand  . Lung biopsy    . Tonsillectomy    . Tubal ligation    . Cardiac catheterization    . Coronary angioplasty  12/2013    stent placement to the distal RCA   Family History  Problem Relation Age of Onset  . Hypertension Mother   . Hyperlipidemia Mother   . Hypertension Father   . Hyperlipidemia Father   . Heart disease Father   . Heart attack Father 32   History  Substance Use Topics  . Smoking status: Former Smoker -- 0.50 packs/day for 35 years    Types: Cigarettes    Quit date: 01/06/2014  . Smokeless tobacco: Not on file  . Alcohol Use: No   OB History   Grav Para Term Preterm Abortions TAB SAB Ect Mult Living                 Review of  Systems  Constitutional:       Per HPI, otherwise negative  HENT:       Per HPI, otherwise negative  Respiratory:       Per HPI, otherwise negative  Cardiovascular:       Per HPI, otherwise negative  Gastrointestinal: Negative for nausea, vomiting and abdominal pain.  Endocrine:       Negative aside from HPI  Genitourinary:       Neg aside from HPI   Musculoskeletal:       Per HPI, otherwise negative  Skin: Negative.   Neurological: Negative for syncope.      Allergies  Percocet  Home Medications   Prior to Admission medications   Medication Sig Start Date End Date Taking? Authorizing Provider  ALPRAZolam Duanne Moron) 0.5 MG tablet Take 0.5 mg by mouth at bedtime.  01/10/14   Historical Provider, MD  aspirin EC 81 MG tablet Take 1 tablet (81 mg total) by mouth daily. 01/17/14   Minna Merritts, MD  BRILINTA 90 MG TABS tablet Take 90 mg by mouth 2 (two) times daily.  01/10/14   Historical Provider, MD  CRESTOR 20 MG tablet Take 20  mg by mouth daily.  01/10/14   Historical Provider, MD  metoprolol tartrate (LOPRESSOR) 25 MG tablet Take 25 mg by mouth 2 (two) times daily.  01/10/14   Historical Provider, MD  NITROSTAT 0.4 MG SL tablet Place 0.4 mg under the tongue every 5 (five) minutes as needed.  01/10/14   Historical Provider, MD  traMADol (ULTRAM) 50 MG tablet Take 50 mg by mouth every 6 (six) hours as needed.  01/10/14   Historical Provider, MD   There were no vitals taken for this visit. Physical Exam  Nursing note and vitals reviewed. Constitutional: She is oriented to person, place, and time. She appears well-developed and well-nourished. No distress.  HENT:  Head: Normocephalic and atraumatic.  Eyes: Conjunctivae and EOM are normal.  Cardiovascular: Normal rate, regular rhythm and normal heart sounds.   BUE pulses are normal and symmetric.    Pulmonary/Chest: Effort normal and breath sounds normal. No stridor. No respiratory distress.  Abdominal: Soft. She exhibits no  distension. There is no tenderness.  Musculoskeletal: She exhibits no edema.  Neurological: She is alert and oriented to person, place, and time. No cranial nerve deficit.  Skin: Skin is warm and dry.  Psychiatric: She has a normal mood and affect.    ED Course  Procedures (including critical care time)  DIAGNOSTIC STUDIES: Oxygen Saturation is 90% on RA, low by my interpretation.    COORDINATION OF CARE: 9:55 AM Discussed treatment plan with pt at bedside and pt agreed to plan.   Labs Review Labs Reviewed  COMPREHENSIVE METABOLIC PANEL - Abnormal; Notable for the following:    Glucose, Bld 144 (*)    Alkaline Phosphatase 126 (*)    All other components within normal limits  CBC  PRO B NATRIURETIC PEPTIDE  MAGNESIUM  PROTIME-INR  TROPONIN I    Imaging Review Dg Chest 2 View  02/09/2014   CLINICAL DATA:  Pain.  EXAM: CHEST  2 VIEW  COMPARISON:  Chest x-ray 02/22/2013.  FINDINGS: Mediastinum and hilar structures are normal. Low lung volumes. Mild basilar atelectasis. No pleural effusion or pneumothorax. Degenerative changes thoracic spine. Calcified pulmonary nodule right lung base consistent with granuloma . No acute bony abnormality.  IMPRESSION: 1. Low lung volumes with bibasilar atelectasis. 2. Stable calcified pulmonary nodule right lung consistent with granuloma .   Electronically Signed   By: Marcello Moores  Register   On: 02/09/2014 10:56     EKG Interpretation   Date/Time:  Thursday February 09 2014 09:47:30 EDT Ventricular Rate:  75 PR Interval:  149 QRS Duration: 154 QT Interval:  409 QTC Calculation: 457 R Axis:   -85 Text Interpretation:  Sinus rhythm RBBB and LAFB Baseline wander in  lead(s) I II aVR aVF V1 V3 V4 V5 Sinus rhythm Right bundle branch block  Left anterior fasicular block Artifact Abnormal ekg Confirmed by Laura Muskrat  MD ([id number]) on 02/09/2014 9:56:41 AM     11:33 AM CP free   2:13 PM Chest pain free, sitting upright, in no distress. Discuss  all results, need to followup with cardiology and primary care.  MDM    I personally performed the services described in this documentation, which was scribed in my presence. The recorded information has been reviewed and is accurate.   Patient presents after an episode of chest pain.  Patient describes the pain as being distinct from that she states prior to prior MI requiring stenting. Review of patient's chart demonstrates largely patent coronary arteries aside from the right  side, which was addressed one month ago. Patient's evaluation here, including serial troponins, non-changed EKG is very reassuring. With several hours of monitoring, with no ongoing chest pain, there is low suspicion for change in the patency of her vasculature. No evidence for PE, nor infection. Patient was discharged to follow up with primary care, cardiology.  Laura Muskrat, MD 02/09/14 1414

## 2014-06-13 ENCOUNTER — Other Ambulatory Visit (HOSPITAL_COMMUNITY): Payer: Self-pay | Admitting: Respiratory Therapy

## 2014-06-13 DIAGNOSIS — G4730 Sleep apnea, unspecified: Secondary | ICD-10-CM

## 2014-06-23 ENCOUNTER — Ambulatory Visit: Payer: 59 | Attending: Neurology | Admitting: Sleep Medicine

## 2014-06-23 VITALS — Ht 63.0 in | Wt 236.0 lb

## 2014-06-23 DIAGNOSIS — G4730 Sleep apnea, unspecified: Secondary | ICD-10-CM

## 2014-06-23 DIAGNOSIS — E669 Obesity, unspecified: Secondary | ICD-10-CM | POA: Diagnosis not present

## 2014-06-23 DIAGNOSIS — R0683 Snoring: Secondary | ICD-10-CM | POA: Insufficient documentation

## 2014-06-23 DIAGNOSIS — G4710 Hypersomnia, unspecified: Secondary | ICD-10-CM | POA: Diagnosis not present

## 2014-07-05 NOTE — Sleep Study (Signed)
  [place] A. Merlene Laughter, MD     [email or web address]        NOCTURNAL POLYSOMNOGRAM    LOCATION: SLEEP LAB FACILITY: [facility]   PHYSICIAN: Dorismar Chay A. Merlene Laughter, M.D.   DATE OF STUDY: 06/23/2014.   REFERRING PHYSICIAN: Sinda Du.   INDICATIONS: The patient is a 53 year old female who presents with hypersomnia, obesity and snoring.  MEDICATIONS:  Prior to Admission medications   Medication Sig Start Date End Date Taking? Authorizing Provider  acetaminophen (TYLENOL) 500 MG tablet Take 1,000 mg by mouth daily as needed for moderate pain.    Historical Provider, MD  ALPRAZolam Duanne Moron) 0.5 MG tablet Take 0.25 mg by mouth daily as needed for anxiety.  01/10/14   Historical Provider, MD  aspirin EC 81 MG tablet Take 1 tablet (81 mg total) by mouth daily. 01/17/14   Minna Merritts, MD  BRILINTA 90 MG TABS tablet Take 90 mg by mouth 2 (two) times daily.  01/10/14   Historical Provider, MD  CRESTOR 20 MG tablet Take 20 mg by mouth daily.  01/10/14   Historical Provider, MD  metoprolol tartrate (LOPRESSOR) 25 MG tablet Take 25 mg by mouth 2 (two) times daily.  01/10/14   Historical Provider, MD  NITROSTAT 0.4 MG SL tablet Place 0.4 mg under the tongue every 5 (five) minutes as needed.  01/10/14   Historical Provider, MD  traMADol (ULTRAM) 50 MG tablet Take 50 mg by mouth every 6 (six) hours as needed for moderate pain.  01/10/14   Historical Provider, MD      EPWORTH SLEEPINESS SCALE: 18.   BMI: 45.   ARCHITECTURAL SUMMARY: Total recording time was 409 minutes. Sleep efficiency 91 %. Sleep latency 7 minutes. REM latency 283 minutes. Stage NI 8 %, N2 77 % and N3 3 % and REM sleep 12 %.    RESPIRATORY DATA:  Baseline oxygen saturation is 95 %. The lowest saturation is a 3 %. The diagnostic AHI is 3. The RDI is 3. The REM AHI is 20.  LIMB MOVEMENT SUMMARY: PLM index 0.   ELECTROCARDIOGRAM SUMMARY: Average heart rate is 65 with no significant dysrhythmias observed.    IMPRESSION:  1. Unremarkable nocturnal polysomnography. Given the marked daytime hypersomnia, sleep consultation is recommended.  Thanks for this referral.  Shawnya Mayor A. Merlene Laughter, M.D. Diplomat, Tax adviser of Sleep Medicine.

## 2014-07-17 ENCOUNTER — Ambulatory Visit (INDEPENDENT_AMBULATORY_CARE_PROVIDER_SITE_OTHER): Payer: 59 | Admitting: Cardiovascular Disease

## 2014-07-17 ENCOUNTER — Encounter: Payer: Self-pay | Admitting: Cardiovascular Disease

## 2014-07-17 ENCOUNTER — Encounter (INDEPENDENT_AMBULATORY_CARE_PROVIDER_SITE_OTHER): Payer: Self-pay

## 2014-07-17 VITALS — BP 130/68 | HR 76 | Ht 63.0 in | Wt 265.5 lb

## 2014-07-17 DIAGNOSIS — F17200 Nicotine dependence, unspecified, uncomplicated: Secondary | ICD-10-CM

## 2014-07-17 DIAGNOSIS — Z720 Tobacco use: Secondary | ICD-10-CM

## 2014-07-17 DIAGNOSIS — R0602 Shortness of breath: Secondary | ICD-10-CM | POA: Insufficient documentation

## 2014-07-17 DIAGNOSIS — E785 Hyperlipidemia, unspecified: Secondary | ICD-10-CM

## 2014-07-17 DIAGNOSIS — M791 Myalgia, unspecified site: Secondary | ICD-10-CM | POA: Insufficient documentation

## 2014-07-17 DIAGNOSIS — I25119 Atherosclerotic heart disease of native coronary artery with unspecified angina pectoris: Secondary | ICD-10-CM

## 2014-07-17 DIAGNOSIS — Z955 Presence of coronary angioplasty implant and graft: Secondary | ICD-10-CM

## 2014-07-17 DIAGNOSIS — I214 Non-ST elevation (NSTEMI) myocardial infarction: Secondary | ICD-10-CM

## 2014-07-17 MED ORDER — FUROSEMIDE 20 MG PO TABS: 20.0000 mg | ORAL_TABLET | Freq: Every day | ORAL | Status: DC | PRN

## 2014-07-17 NOTE — Assessment & Plan Note (Signed)
Will stay on Crestor 20 mg. For symptoms of myalgia, we did offer her to cut her Crestor down to 10 mg daily. She'll continue on the 20 mg for now

## 2014-07-17 NOTE — Assessment & Plan Note (Signed)
Tolerable symptoms. She will stay on Crestor for now. If symptoms get worse, she will cut her Crestor in half

## 2014-07-17 NOTE — Assessment & Plan Note (Signed)
Currently with no symptoms of angina. No further workup at this time. Continue current medication regimen. She will stay on the aspirin and brilinta at this time. Plavix was offered but she prefers to stay on her current medications

## 2014-07-17 NOTE — Assessment & Plan Note (Signed)
Long discussion today concerning her antiplatelet medications. We did offer Plavix. We did discuss the difference between the medications. She'll stay on her current medication regimen for now. Coupon card provided.

## 2014-07-17 NOTE — Assessment & Plan Note (Signed)
Etiology of her shortness of breath is unclear, possible diastolic CHF. We have given her a prescription for Lasix. She will take this with a banana periodically for symptoms. She does have significant by mouth fluid intake. Minimal edema on today's visit. Shortness of breath also could be secondary to morbid obesity and deconditioning

## 2014-07-17 NOTE — Progress Notes (Signed)
Patient ID: Laura Gillespie, female    DOB: [birth year], 53 y.o.   MRN: [id number]  HPI Comments: [name] is a very pleasant 53 year old woman with long history of smoking, obesity, hyperlipidemia, strong family history of CAD who presented to the hospital 01/07/2014 with chest pain, angina, non-STEMI. She had elevated cardiac enzymes with troponin 0.65. Cardiac catheterization was performed that showed severe distal RCA disease. Bare-metal stent was placed with improvement of her symptoms She presents today for follow-up of her coronary artery disease  She reports that she has significant bruising and bleeding on the brilinta. Some shortness of breath. She feels that her legs feel tight, she has shortness of breath with any exertion. Unable to tie her shoes. Feels that she has extra fluid and may need a diuretic. Reports blood pressure has been relatively well controlled. No recent chest pain concerning for angina  EKG shows normal sinus rhythm with no significant ST or T-wave changes, heart rate 76 bpm  Recent lab work reviewed with her showing total cholesterol 142, LDL 46. She wonders if the Crestor could be causing her muscle aches In general she is active.  Previous past medical history Echocardiogram in the hospital was essentially normal  Cardiac catheterization showed 99% distal RCA disease, down to 0% residual disease following the stent placement      Outpatient Encounter Prescriptions as of 07/17/2014  Medication Sig  . acetaminophen (TYLENOL) 500 MG tablet Take 1,000 mg by mouth daily as needed for moderate pain.  Marland Kitchen ALPRAZolam (XANAX) 0.25 MG tablet Take 0.25 mg by mouth 2 (two) times daily as needed.  . ALPRAZolam (XANAX) 0.5 MG tablet Take 0.25 mg by mouth daily as needed for anxiety.   Marland Kitchen aspirin EC 81 MG tablet Take 1 tablet (81 mg total) by mouth daily.  Marland Kitchen BRILINTA 90 MG TABS tablet Take 90 mg by mouth 2 (two) times daily.   . CRESTOR 20 MG tablet Take 20 mg by  mouth daily.   Marland Kitchen escitalopram (LEXAPRO) 10 MG tablet Take 10 mg by mouth daily.  . metoprolol tartrate (LOPRESSOR) 25 MG tablet Take 25 mg by mouth 2 (two) times daily.   Marland Kitchen NITROSTAT 0.4 MG SL tablet Place 0.4 mg under the tongue every 5 (five) minutes as needed.   . traMADol (ULTRAM) 50 MG tablet Take 50 mg by mouth every 6 (six) hours as needed for moderate pain.   . furosemide (LASIX) 20 MG tablet Take 1 tablet (20 mg total) by mouth daily as needed.   Social history  reports that she quit smoking about 6 months ago. Her smoking use included Cigarettes. She has a 17.5 pack-year smoking history. She has never used smokeless tobacco. She reports that she does not drink alcohol or use illicit drugs.   Review of Systems  Constitutional: Negative.   Eyes: Negative.   Respiratory: Positive for cough.   Cardiovascular: Negative.   Gastrointestinal: Negative.   Musculoskeletal: Positive for myalgias.  Skin: Negative.   Neurological: Negative.   Hematological: Negative.   Psychiatric/Behavioral: Negative.   All other systems reviewed and are negative.   BP 130/68 mmHg  Pulse 76  Ht 5\' 3"  (1.6 m)  Wt 265 lb 8 oz (120.43 kg)  BMI 47.04 kg/m2  Physical Exam  Constitutional: She is oriented to person, place, and time. She appears well-developed and well-nourished.  Obese  HENT:  Head: Normocephalic.  Nose: Nose normal.  Mouth/Throat: Oropharynx is clear and moist.  Eyes: Conjunctivae are normal.  Pupils are equal, round, and reactive to light.  Neck: Normal range of motion. Neck supple. No JVD present.  Cardiovascular: Normal rate, regular rhythm, S1 normal, S2 normal, normal heart sounds and intact distal pulses.  Exam reveals no gallop and no friction rub.   No murmur heard. Pulmonary/Chest: Effort normal and breath sounds normal. No respiratory distress. She has no wheezes. She has no rales. She exhibits no tenderness.  Abdominal: Soft. Bowel sounds are normal. She exhibits no  distension. There is no tenderness.  Musculoskeletal: Normal range of motion. She exhibits no edema or tenderness.  Lymphadenopathy:    She has no cervical adenopathy.  Neurological: She is alert and oriented to person, place, and time. Coordination normal.  Skin: Skin is warm and dry. No rash noted. No erythema.  Psychiatric: She has a normal mood and affect. Her behavior is normal. Judgment and thought content normal.    Assessment and Plan  Nursing note and vitals reviewed.

## 2014-07-17 NOTE — Assessment & Plan Note (Signed)
No recent smoking for the past 6 months

## 2014-07-17 NOTE — Patient Instructions (Signed)
You are doing well.  Please start lasix as needed for leg swelling, shortness of breath Take with potassium (banana)  Call if you would like to change to plavix (with aspirin)  Please call us if you have new issues that need to be addressed before your next appt.  Your physician wants you to follow-up in: 12 months.  You will receive a reminder letter in the mail two months in advance. If you don't receive a letter, please call our office to schedule the follow-up appointment.

## 2014-09-20 ENCOUNTER — Ambulatory Visit: Payer: Self-pay | Admitting: Emergency Medicine

## 2014-12-16 NOTE — Discharge Summary (Signed)
PATIENT NAME:  Laura Gillespie, Laura Gillespie MR#:  [id number] DATE OF BIRTH:  1961-06-24  DATE OF ADMISSION:  01/06/2014 DATE OF DISCHARGE:  01/10/2014   ADMITTING PHYSICIAN: Vivek J. Verdell Carmine, MD  DISCHARGING PHYSICIAN: Gladstone Lighter, MD  [place]: Cardiology consultation with Dr. Rockey Situ and Dr. Fletcher Anon.   PRIMARY CARE PHYSICIAN: In [place].   DISCHARGE DIAGNOSES:  1. Acute non-ST segment elevation myocardial infarction, status post bare metal stent put in for distal RCA 99% blockage.  2. Tobacco use disorder.  3. Hyperlipidemia.  4. Early arthritis.  5. Anxiety.   DISCHARGE HOME MEDICATIONS:  1. Aspirin 81 mg p.o. daily.  2. Brilinta 90 mg p.o. twice a day.  3. Xanax 0.5 mg twice a day as needed for anxiety.  4. Metoprolol 25 mg p.o. b.i.d.  5. Crestor 20 mg p.o. daily.  6. Tramadol 50 mg q.8 hours p.r.n. for pain.  7. Nitroglycerin 0.4 mg sublingual p.r.n. for chest pain every 5 minutes.   DISCHARGE DIET: Low-sodium and low-fat diet.   DISCHARGE ACTIVITY: As tolerated.    FOLLOWUP INSTRUCTIONS:  1. PCP followup in 2 weeks.  2. Cardiology followup in 1 week.  3. Recommend cardiac rehab.   LABS AND IMAGING STUDIES PRIOR TO DISCHARGE:  Cardiac catheterization done on 01/09/2014 via radial artery access showing 99% lesion in distal RCA, and bare metal stent was placed, and following intervention, there was 0% residual blockage.  Sodium 139, potassium 4.4, chloride 107, bicarbonate 23, BUN 15, creatinine 0.80, glucose 100 and calcium of 9.3. LDL cholesterol 171, HDL 45, total cholesterol 259 and triglycerides of 215.  Echo Doppler showing normal LV ejection fraction, EF of 55% to 60%, normal wall motion.  Urinalysis is negative for any infection.  Chest x-ray showing chronic scarring at the right lung base with evidence of old granulomatous disease, and the patient had right lower lobectomy done.  WBC 7.6, hemoglobin is 13.4, hematocrit is 40.4, platelet count  296.   BRIEF HOSPITAL COURSE: Laura Gillespie is a 54 year old Caucasian female with history of hypertension, hyperlipidemia, not on any medication, and who is chronic smoker, who presents to the hospital with chest pain and noted to have elevated troponin.   1. Acute non-ST segment elevation myocardial infarction. The patient was seen by cardiology and had a cardiac catheterization done, which revealed 99% distal RCA blockage, and she was heparinized, placed on the right medications and had a bare metal stent put in for distal RCA. The patient is on aspirin, Brilinta, metoprolol and statin at this time. She was strongly counseled against smoking, which she is motivated to quit at this time.  2. Hyperlipemia. The patient was started on simvastatin in the hospital; however, she used to use Crestor in the past and tolerated it very well, without any side effects, so she is being discharged on high dose of Crestor.  3. Anxiety. Had some anxiety issues while in the hospital, for which she is on Xanax as needed.  4. Early arthritis. The patient was taking Motrin at home, which was discontinued due to risk of bleeding and acute myocardial infarction, and she is given a prescription for tramadol to be used as needed.  6. Her course has been otherwise uneventful in the hospital.   DISCHARGE CONDITION: Stable.   DISCHARGE DISPOSITION: Home.   TIME SPENT ON DISCHARGE: 45 minutes.   ____________________________ Gladstone Lighter, MD rk:lb D: 01/10/2014 07:38:41 ET T: 01/10/2014 07:54:48 ET JOB#: [id number]  cc: Gladstone Lighter, MD, <Dictator> Muhammad A. Fletcher Anon,  MD Gladstone Lighter MD ELECTRONICALLY SIGNED 01/19/2014 12:24

## 2014-12-16 NOTE — Consult Note (Signed)
General Aspect 54 year old female with a hx of obesity, hyperlipidemia, long hx of smoking, CAD family hx, who comes in with a 2-week history of intermittent chest burning. Cardiology was consulted for elevated cardiac enz, angina and NSTEMI.   She reports having heaviness and tightness in her chest which radiates to her left arm for the past two weeks, worse with exertion, improves with rest.  She denies any shortness of breath, No nausea, no vomiting, no diaphoresis, no dizziness and no other associated symptoms.   She went to urgent care today for further evaluation and they sent her over to the ER.  In the Emergency Room, the patient was noted to have an elevated troponin at 0.5.  Pain sx resolved once she was started on heparin infusion.   No significant pain overmnight on heparin. Peak troponin 0.65   Present Illness . SOCIAL HISTORY: Does smoke about a pack per day, has been smoking on and off for the past 20 to 25 years. No alcohol abuse. No illicit drug abuse. Lives at home with her husband.   FAMILY HISTORY: Mother and father are both alive. Mother has diabetes. The patient has a grandfather with diabetes. Father had a heart attack. He had bypass surgery. He also has vascular disease with aortic aneurysm.   Physical Exam:  GEN well developed, well nourished, no acute distress, obese   HEENT hearing intact to voice, moist oral mucosa   NECK supple   RESP normal resp effort  clear BS   CARD Regular rate and rhythm  No murmur   ABD soft   EXTR negative edema   SKIN normal to palpation   NEURO motor/sensory function intact   PSYCH alert, A+O to time, place, person, good insight   Review of Systems:  Subjective/Chief Complaint Chest burning, now resolved   Skin: No Complaints   ENT: No Complaints   Eyes: No Complaints   Neck: No Complaints   Respiratory: No Complaints   Cardiovascular: Chest pain or discomfort   Gastrointestinal: No Complaints    Genitourinary: No Complaints   Vascular: No Complaints   Musculoskeletal: No Complaints   Neurologic: No Complaints   Hematologic: No Complaints   Endocrine: No Complaints   Psychiatric: No Complaints   Review of Systems: All other systems were reviewed and found to be negative   Medications/Allergies Reviewed Medications/Allergies reviewed   Family & Social History:  Family and Social History:  Family History Coronary Artery Disease  COPD   Social History positive  tobacco, negative ETOH   Place of Living Home     bronchitis:    Anxiety:    allergies:    cholesterol:    hypertension:    Tonsillectomy:    Cholecystectomy:    Tubal Ligation:        Admit Diagnosis:   NSTEMI: Onset Date: 07-Jan-2014, Status: Active, Description: NSTEMI  Home Medications: Medication Instructions Status  ibuprofen 200 mg oral tablet 3 tabs (600mg ) orally every 6 hours, As Needed Active  Ventolin CFC free 90 mcg/inh inhalation aerosol 2 puff(s) inhaled 4 times a day, As Needed - for Shortness of Breath, for Wheezing  Active   Lab Results:  Routine Chem:  16-May-15 01:50   Result Comment TROPONIN - RESULTS VERIFIED BY REPEAT TESTING.  - HIGH PREVIOUSLY CALLED BY K[facility] AT 1837 ON  - 01-06-14/[facility]WW  Result(s) reported on 07 Jan 2014 at 02:51AM.  Cholesterol, Serum  259  Triglycerides, Serum  215  HDL (INHOUSE) 45  VLDL  Cholesterol Calculated  43  LDL Cholesterol Calculated  171 (Result(s) reported on 07 Jan 2014 at 02:36AM.)  Cardiac:  15-May-15 17:46   Troponin I  0.58 (0.00-0.05 0.05 ng/mL or less: NEGATIVE  Repeat testing in 3-6 hrs  if clinically indicated. >0.05 ng/mL: POTENTIAL  MYOCARDIAL INJURY. Repeat  testing in 3-6 hrs if  clinically indicated. NOTE: An increase or decrease  of 30% or more on serial  testing suggests a  clinically important change)    21:41   Troponin I  0.65 (0.00-0.05 0.05 ng/mL or less: NEGATIVE  Repeat testing in 3-6 hrs  if  clinically indicated. >0.05 ng/mL: POTENTIAL  MYOCARDIAL INJURY. Repeat  testing in 3-6 hrs if  clinically indicated. NOTE: An increase or decrease  of 30% or more on serial  testing suggests a  clinically important change)  16-May-15 01:50   Troponin I  0.50 (0.00-0.05 0.05 ng/mL or less: NEGATIVE  Repeat testing in 3-6 hrs  if clinically indicated. >0.05 ng/mL: POTENTIAL  MYOCARDIAL INJURY. Repeat  testing in 3-6 hrs if  clinically indicated. NOTE: An increase or decrease  of 30% or more on serial  testing suggests a  clinically important change)  CPK-MB, Serum 2.0 (Result(s) reported on 07 Jan 2014 at 02:44AM.)  Routine Coag:  16-May-15 01:50   Activated PTT (APTT)  72.2 (A HCT value >55% may artifactually increase the APTT. In one study, the increase was an average of 19%. Reference: "Effect on Routine and Special Coagulation Testing Values of Citrate Anticoagulant Adjustment in Patients with High HCT Values." American Journal of Clinical Pathology 2006;126:400-405.)   EKG:  Interpretation EKG shows NSR with no significant ST or T wave changes   Radiology Results: XRay:    15-May-15 17:52, Chest Portable Single View  Chest Portable Single View   REASON FOR EXAM:    Chest pain  COMMENTS:       PROCEDURE: DXR - DXR PORTABLE CHEST SINGLE VIEW  - Jan 06 2014  5:52PM     CLINICAL DATA:  Chest pain.  Tobacco use.    EXAM:  PORTABLE CHEST - 1 VIEW    COMPARISON:  DG CHEST 2V dated 05/13/2013; DG CHEST 2V dated  05/19/2011    FINDINGS:  Chronic right basilar scarring. Old granulomatous disease noted.  Cardiac and mediastinal margins appear normal. The lungs appear  otherwise clear. Wedge resection clips noted in the right suprahilar  region.     IMPRESSION:  1. Chronic scarring at the right lung base with evidence of old  granulomatous disease. No acute findings.      Electronically Signed    By: Sherryl Barters M.D.    On: 01/06/2014 18:01          Verified By: Carron Curie, M.D.,    [place] 10/325: Itching  Vital Signs/Nurse's Notes: **Vital Signs.:   16-May-15 09:15  Vital Signs Type Routine  Pulse Pulse 72  Respirations Respirations 18  Systolic BP Systolic BP 657  Diastolic BP (mmHg) Diastolic BP (mmHg) 78  Mean BP 96  Pulse Ox % Pulse Ox % 98  Pulse Ox Activity Level  At rest  Oxygen Delivery Room Air/ 21 %    Impression 54 year old female with a hx of obesity, hyperlipidemia, long hx of smoking, CAD family hx, who comes in with a 2-week history of intermittent chest burning. Cardiology was consulted for elevated cardiac enz, angina and NSTEMI.  1) NSTEMI troponin >0.5 x 3, peak 0.65 Pain free on heparin infusion.  Symptoms consistent with unstable angina. No significant EKG shanges noted Options discussed with the patient. SHe is high risk for CAD, lojng smoking hx, total cholesterol 260 --Plan is for cardiac cath on MOnday, second case at 9 AM Risks and benefits of cardiac cath discussed with the patient. She is willing to proceed.   2) SMoking: Recommended smoking cessation consider a patch  3) Hyperlipidemia would start a statin, lipitor 40 mg daily   Electronic Signatures: Ida Rogue (MD)  (Signed 16-May-15 10:42)  Authored: General Aspect/Present Illness, History and Physical Exam, Review of System, Family & Social History, Past Medical History, Health Issues, Home Medications, Labs, EKG , Radiology, Allergies, Vital Signs/Nurse's Notes, Impression/Plan   Last Updated: 16-May-15 10:42 by Ida Rogue (MD)

## 2014-12-16 NOTE — H&P (Signed)
PATIENT NAME:  Laura Gillespie, Laura Gillespie MR#:  [id number] DATE OF BIRTH:  1961/05/06  DATE OF ADMISSION:  01/06/2014  PRIMARY CARE PHYSICIAN: Dr. Sinda Du in [place].  CHIEF COMPLAINT: Chest burning and pain.   HISTORY OF PRESENT ILLNESS: This is a 54 year old female who comes in with a 2-week history of intermittent chest burning. She describes the feeling as heaviness and tightness in her chest which also radiates to her left arm. It has been intermittently happening now for the past 2 weeks. It gets worse with exertion and improves with rest. She has not tried taking anything that would improve her symptoms. She denies any shortness of breath with it. No nausea, no vomiting, no diaphoresis, no dizziness and no other associated symptoms. She went to urgent care today for further evaluation and they sent her over to the ER. Here in the Emergency Room, the patient was noted to have a mildly elevated troponin at 0.5. Hospitalist services were contacted for further treatment and evaluation.   REVIEW OF SYSTEMS:  CONSTITUTIONAL: No documented fever. No weight gain. No weight loss.  EYES: No blurry, double vision.  ENT: No tinnitus. No postnasal drip. No redness of the oropharynx.  RESPIRATORY: No cough, no wheeze, no hemoptysis, no dyspnea.  CARDIOVASCULAR: Positive chest pain. No orthopnea, no palpitations, no syncope.  GASTROINTESTINAL: No nausea, no vomiting, no diarrhea. No abdominal pain. No melena or hematochezia.  GENITOURINARY: No dysuria or hematuria.  ENDOCRINE: No polyuria or nocturia, heat or cold intolerance.  HEMATOLOGIC: No anemia, no bruising, no bleeding.  INTEGUMENTARY: No rashes. No lesions.  MUSCULOSKELETAL: No arthritis, no swelling, no gout.  NEUROLOGIC: No numbness or tingling. No ataxia. No seizure-type activity.  PSYCHIATRIC:  No anxiety, no insomnia. No ADD.   PAST MEDICAL HISTORY: Consistent with hypertension, hyperlipidemia, ongoing tobacco abuse.   ALLERGIES: No  known drug allergies.   SOCIAL HISTORY: Does smoke about a pack per day, has been smoking on and off for the past 20 to 25 years. No alcohol abuse. No illicit drug abuse. Lives at home with her husband.   FAMILY HISTORY: Mother and father are both alive. Mother has diabetes. The patient has a grandfather with diabetes. Father had a heart attack. He had bypass surgery. He also has vascular disease with aortic aneurysm.   CURRENT MEDICATIONS: The patient is currently only taking ibuprofen 600 mg as needed. No other prescription medications.   PHYSICAL EXAMINATION: Presently is as follows: VITAL SIGNS:  Noted to be temperature 97.9, pulse 87, respirations 20, blood pressure 131/63, sats 99% on room air. GENERAL:  She is a pleasant-appearing female. No apparent distress. HEAD, EYES, EARS, NOSE AND THROAT:  Atraumatic, normocephalic. Extraocular muscles are intact. Pupils equal and reactive to light. Sclerae anicteric. No conjunctival injection. No pharyngeal erythema.  NECK: Supple. No jugular venous distention. No bruits. No lymphadenopathy. No thyromegaly.  HEART: Regular rate and rhythm. No murmurs, no rubs, no clicks.  LUNGS: Clear to auscultation bilaterally. No rales or rhonchi. No wheezes.  ABDOMEN: Soft, flat, nontender, nondistended. Has good bowel sounds. No hepatosplenomegaly appreciated.  EXTREMITIES: No evidence of any cyanosis, clubbing or peripheral edema. Has +2 pedal and radial pulses bilaterally.  NEUROLOGICAL: The patient is alert, awake, oriented x 3 with no focal motor or sensory deficits appreciated bilaterally.  SKIN: Moist, warm with no rashes appreciated.  LYMPHATIC: There is no cervical, axillary lymphadenopathy.   LABORATORY DATA: Showed a serum glucose of 90, BUN 9, creatinine 0.9. Sodium 140, potassium 3.9, chloride 107, bicarb  28. LFTs are within normal limits. Troponin 0.58. White cell count 7.6, hemoglobin 13.4, hematocrit 40.4, platelet count 296. Urinalysis within  normal limits.   The patient did have a chest x-ray done which showed chronic scarring of the right lung base with evidence of old granulomatous disease but no acute findings. The patient's EKG shows normal sinus rhythm with left access deviation, incomplete right bundle branch block. This is unchanged from her previous EKG. There is no acute ST or T wave changes.   ASSESSMENT AND PLAN: This is a 54 year old female with history of hypertension, hyperlipidemia, ongoing tobacco abuse who is not taking any meds presently, comes to the hospital due to intermittent chest pain and burning, noted to have elevated troponin consistent with a non-ST elevation myocardial infarction.  1.  Non-ST-elevation myocardial infarction. This is a likely diagnosis given her clinical symptoms and her risk factors. The patient is apparently noncompliant, not taking any medications presently. For now, I will start the patient on aspirin, heparin nomogram, add some beta blocker, also a statin, some nitroglycerin, morphine and oxygen. I will cycle her cardiac markers, get a two-dimensional echocardiogram. Also get a cardiology consult. Discussed the case with Dr. Rockey Situ who will see the patient. The patient likely will need a cardiac catheterization in the near future.  2.  Hypertension. I will start the patient on some low-dose metoprolol given her non-ST segment elevation myocardial infarction and follow hemodynamics. 3.  Hyperlipidemia. The patient is currently not taking any medications. I will check a lipid profile, start her on a statin.  4.  Tobacco abuse. I offered nicotine patch to the patient, but she refused.   CODE STATUS: The patient is a FULL CODE.  TIME SPENT ON ADMISSION:  45 minutes.   ____________________________ Belia Heman. Verdell Carmine, MD vjs:ce D: 01/06/2014 20:14:44 ET T: 01/06/2014 20:28:04 ET JOB#: [id number]  cc: Belia Heman. Verdell Carmine, MD, <Dictator> Henreitta Leber MD ELECTRONICALLY SIGNED 01/18/2014 15:07

## 2015-08-08 ENCOUNTER — Telehealth: Payer: Self-pay | Admitting: Cardiovascular Disease

## 2015-08-08 NOTE — Telephone Encounter (Signed)
3 attempts to schedule fu from recall list. lmov to schedule.  Deleting recall.

## 2015-08-30 DIAGNOSIS — J441 Chronic obstructive pulmonary disease with (acute) exacerbation: Secondary | ICD-10-CM | POA: Diagnosis not present

## 2015-09-03 DIAGNOSIS — I2510 Atherosclerotic heart disease of native coronary artery without angina pectoris: Secondary | ICD-10-CM | POA: Diagnosis not present

## 2015-09-03 DIAGNOSIS — I10 Essential (primary) hypertension: Secondary | ICD-10-CM | POA: Diagnosis not present

## 2015-09-03 DIAGNOSIS — J441 Chronic obstructive pulmonary disease with (acute) exacerbation: Secondary | ICD-10-CM | POA: Diagnosis not present

## 2015-09-03 DIAGNOSIS — F419 Anxiety disorder, unspecified: Secondary | ICD-10-CM | POA: Diagnosis not present

## 2015-10-17 MED FILL — PROAIR HFA 90 MCG INHALER: 108 (90 BAS | 25 days supply | Qty: 9 | Fill #0

## 2015-10-17 MED FILL — ROSUVASTATIN CALCIUM 20 MG: 20 | 90 days supply | Qty: 90 | Fill #2

## 2015-10-17 MED FILL — METOPROLOL TARTRATE 25 MG T: 25 | 90 days supply | Qty: 180 | Fill #2

## 2015-10-29 ENCOUNTER — Encounter: Payer: Self-pay | Admitting: Cardiovascular Disease

## 2015-10-29 ENCOUNTER — Ambulatory Visit (INDEPENDENT_AMBULATORY_CARE_PROVIDER_SITE_OTHER): Payer: 59 | Admitting: Cardiovascular Disease

## 2015-10-29 VITALS — BP 130/80 | HR 71 | Ht 63.0 in | Wt 247.8 lb

## 2015-10-29 DIAGNOSIS — R0602 Shortness of breath: Secondary | ICD-10-CM

## 2015-10-29 DIAGNOSIS — I214 Non-ST elevation (NSTEMI) myocardial infarction: Secondary | ICD-10-CM | POA: Diagnosis not present

## 2015-10-29 DIAGNOSIS — Z955 Presence of coronary angioplasty implant and graft: Secondary | ICD-10-CM | POA: Diagnosis not present

## 2015-10-29 DIAGNOSIS — I25119 Atherosclerotic heart disease of native coronary artery with unspecified angina pectoris: Secondary | ICD-10-CM

## 2015-10-29 DIAGNOSIS — Z720 Tobacco use: Secondary | ICD-10-CM | POA: Diagnosis not present

## 2015-10-29 DIAGNOSIS — M791 Myalgia, unspecified site: Secondary | ICD-10-CM

## 2015-10-29 DIAGNOSIS — E785 Hyperlipidemia, unspecified: Secondary | ICD-10-CM

## 2015-10-29 DIAGNOSIS — F17200 Nicotine dependence, unspecified, uncomplicated: Secondary | ICD-10-CM

## 2015-10-29 NOTE — Assessment & Plan Note (Signed)
Congratulated her on 20 pound weight loss since her last clinic visit

## 2015-10-29 NOTE — Assessment & Plan Note (Signed)
Cholesterol is at goal on the current lipid regimen. No changes to the medications were made.  

## 2015-10-29 NOTE — Patient Instructions (Signed)
You are doing well. No medication changes were made.  Please call us if you have new issues that need to be addressed before your next appt.  Your physician wants you to follow-up in: 12 months.  You will receive a reminder letter in the mail two months in advance. If you don't receive a letter, please call our office to schedule the follow-up appointment. 

## 2015-10-29 NOTE — Assessment & Plan Note (Signed)
Currently with no symptoms of angina. No further workup at this time. Continue current medication regimen. COntinue aspirin, she stopped brilinta on her own

## 2015-10-29 NOTE — Progress Notes (Signed)
Patient ID: Laura Gillespie, female    DOB: 1961-06-05, 55 y.o.   MRN: [id number]  HPI Comments: Laura Gillespie is a very pleasant 55 year old woman with long history of smoking, obesity, hyperlipidemia, strong family history of CAD who presented to the hospital 01/07/2014 with chest pain, angina, non-STEMI. She had elevated cardiac enzymes with troponin 0.65. Cardiac catheterization was performed that showed severe distal RCA disease. Bare-metal stent was placed with improvement of her symptoms She presents today for follow-up of her coronary artery disease  In follow-up today, she reports that she stopped her brilinta one year ago  She continues to take aspirin daily, Crestor 20 g daily  Occasionally has some tightness in her left shoulder region, posterior shoulder down between her scapula and spine  Tenderness to palpation and with range of motion  She's not been taking NSAIDs   she does have problems with fatigue, does not sleep well secondary to hot flashes, also has 3 dogs at sleep on her bed  She has been walking more for weight loss and conditioning, weight is down 20 pounds from her prior clinic visit   EKG shows normal sinus rhythm with no significant ST or T-wave changes, heart rate 71 bpm, right bundle branch block   Other past medical history reviewed   total cholesterol 142, LDL 46. in 2015  Echocardiogram in the hospital was essentially normal  Cardiac catheterization showed 99% distal RCA disease, down to 0% residual disease following the stent placement     Allergies  Allergen Reactions  . Percocet [Oxycodone-Acetaminophen]     Itching & rash     Current Outpatient Prescriptions on File Prior to Visit  Medication Sig Dispense Refill  . acetaminophen (TYLENOL) 500 MG tablet Take 1,000 mg by mouth daily as needed for moderate pain.    Marland Kitchen ALPRAZolam (XANAX) 0.5 MG tablet Take 0.25 mg by mouth daily as needed for anxiety.     Marland Kitchen aspirin EC 81 MG tablet Take 1 tablet  (81 mg total) by mouth daily. 90 tablet 3  . CRESTOR 20 MG tablet Take 20 mg by mouth daily.     . metoprolol tartrate (LOPRESSOR) 25 MG tablet Take 25 mg by mouth 2 (two) times daily.     Marland Kitchen NITROSTAT 0.4 MG SL tablet Place 0.4 mg under the tongue every 5 (five) minutes as needed.     . traMADol (ULTRAM) 50 MG tablet Take 50 mg by mouth every 6 (six) hours as needed for moderate pain.      No current facility-administered medications on file prior to visit.    Past Medical History  Diagnosis Date  . Hypertension   . Hyperlipidemia   . Acute non-ST segment elevation myocardial infarction ([place])   . Coronary artery disease 12/2013    s/p bare metal stent to distal RCA    Past Surgical History  Procedure Laterality Date  . Cholecystectomy    . Carpal tunnel release      both hand  . Lung biopsy    . Tonsillectomy    . Tubal ligation    . Cardiac catheterization    . Coronary angioplasty  12/2013    stent placement to the distal RCA    Social History  reports that she has been smoking Cigarettes.  She has a 17.5 pack-year smoking history. She has never used smokeless tobacco. She reports that she does not drink alcohol or use illicit drugs.  Family History family history includes Heart attack (age  of onset: 77) in her father; Heart disease in her father; Hyperlipidemia in her father and mother; Hypertension in her father and mother.   Review of Systems  Constitutional: Negative.   Eyes: Negative.   Respiratory: Negative.   Cardiovascular: Negative.   Gastrointestinal: Negative.   Musculoskeletal: Positive for myalgias.  Neurological: Negative.   Hematological: Negative.   Psychiatric/Behavioral: Negative.   All other systems reviewed and are negative.   BP 130/80 mmHg  Pulse 71  Ht 5\' 3"  (1.6 m)  Wt 247 lb 12 oz (112.379 kg)  BMI 43.90 kg/m2  Physical Exam  Constitutional: She is oriented to person, place, and time. She appears well-developed and well-nourished.   Obese  HENT:  Head: Normocephalic.  Nose: Nose normal.  Mouth/Throat: Oropharynx is clear and moist.  Eyes: Conjunctivae are normal. Pupils are equal, round, and reactive to light.  Neck: Normal range of motion. Neck supple. No JVD present.  Cardiovascular: Normal rate, regular rhythm, S1 normal, S2 normal, normal heart sounds and intact distal pulses.  Exam reveals no gallop and no friction rub.   No murmur heard. Pulmonary/Chest: Effort normal and breath sounds normal. No respiratory distress. She has no wheezes. She has no rales. She exhibits no tenderness.  Abdominal: Soft. Bowel sounds are normal. She exhibits no distension. There is no tenderness.  Musculoskeletal: Normal range of motion. She exhibits no edema or tenderness.  Lymphadenopathy:    She has no cervical adenopathy.  Neurological: She is alert and oriented to person, place, and time. Coordination normal.  Skin: Skin is warm and dry. No rash noted. No erythema.  Psychiatric: She has a normal mood and affect. Her behavior is normal. Judgment and thought content normal.    Assessment and Plan  Nursing note and vitals reviewed.

## 2015-10-29 NOTE — Assessment & Plan Note (Signed)
She does have scant crackles  Left upper lung field anteriorly  Recommended we monitor this carefully.  Unable to  Exclude old scarring, pulmonary fibrosis

## 2015-10-29 NOTE — Assessment & Plan Note (Signed)
Some paravertebral muscle spasms on palpation left posterior shoulder  Recommended NSAIDs in moderation, size, range of motion exercises, heat packs  Possibly exacerbated by  statin

## 2015-10-29 NOTE — Assessment & Plan Note (Signed)
She is still smoking one half pack per day We have encouraged her to continue to work on weaning her cigarettes and smoking cessation. She will continue to work on this and does not want any assistance with chantix.

## 2015-11-21 MED FILL — PROAIR HFA 90 MCG INHALER: 108 (90 BAS | 25 days supply | Qty: 9 | Fill #1

## 2015-12-31 DIAGNOSIS — I10 Essential (primary) hypertension: Secondary | ICD-10-CM | POA: Diagnosis not present

## 2015-12-31 DIAGNOSIS — I2510 Atherosclerotic heart disease of native coronary artery without angina pectoris: Secondary | ICD-10-CM | POA: Diagnosis not present

## 2015-12-31 DIAGNOSIS — F419 Anxiety disorder, unspecified: Secondary | ICD-10-CM | POA: Diagnosis not present

## 2015-12-31 DIAGNOSIS — J449 Chronic obstructive pulmonary disease, unspecified: Secondary | ICD-10-CM | POA: Diagnosis not present

## 2015-12-31 MED FILL — ALPRAZolam 0.5 MG TABS: 0.5 | 90 days supply | Qty: 180 | Fill #0

## 2016-01-14 ENCOUNTER — Ambulatory Visit (HOSPITAL_COMMUNITY)
Admission: RE | Admit: 2016-01-14 | Discharge: 2016-01-14 | Disposition: A | Discharge status: Home / Self Care | Payer: 59 | Source: Ambulatory Visit | Attending: Pulmonary Disease | Admitting: Pulmonary Disease

## 2016-01-14 ENCOUNTER — Other Ambulatory Visit: Payer: Self-pay | Admitting: Pulmonary Disease

## 2016-01-14 DIAGNOSIS — R05 Cough: Secondary | ICD-10-CM | POA: Insufficient documentation

## 2016-01-14 DIAGNOSIS — R059 Cough, unspecified: Secondary | ICD-10-CM

## 2016-01-14 DIAGNOSIS — R0602 Shortness of breath: Secondary | ICD-10-CM | POA: Diagnosis not present

## 2016-01-14 DIAGNOSIS — J984 Other disorders of lung: Secondary | ICD-10-CM | POA: Diagnosis not present

## 2016-01-14 DIAGNOSIS — I2510 Atherosclerotic heart disease of native coronary artery without angina pectoris: Secondary | ICD-10-CM | POA: Diagnosis not present

## 2016-01-14 DIAGNOSIS — I10 Essential (primary) hypertension: Secondary | ICD-10-CM | POA: Diagnosis not present

## 2016-01-14 DIAGNOSIS — J441 Chronic obstructive pulmonary disease with (acute) exacerbation: Secondary | ICD-10-CM | POA: Diagnosis not present

## 2016-01-14 MED FILL — PROAIR HFA 90 MCG INHALER: 108 (90 BAS | 25 days supply | Qty: 9 | Fill #2

## 2016-01-14 MED FILL — ROSUVASTATIN CALCIUM 20 MG: 20 | 90 days supply | Qty: 90 | Fill #3

## 2016-02-18 MED FILL — PROAIR HFA 90 MCG INHALER: 108 (90 BAS | 25 days supply | Qty: 9 | Fill #3

## 2016-02-18 MED FILL — METOPROLOL TARTRATE 25 MG T: 25 | 90 days supply | Qty: 180 | Fill #3

## 2016-04-17 MED FILL — PROAIR HFA 90 MCG INHALER: 108 (90 BAS | 25 days supply | Qty: 9 | Fill #4

## 2016-05-08 MED FILL — ROSUVASTATIN CALCIUM 20 MG: 20 | 90 days supply | Qty: 90 | Fill #0

## 2016-05-08 MED FILL — ALPRAZolam 0.5 MG TABS: 0.5 | 90 days supply | Qty: 180 | Fill #1

## 2016-08-07 MED FILL — METOPROLOL TARTRATE 25 MG T: 25 | 90 days supply | Qty: 180 | Fill #0

## 2016-08-07 MED FILL — ROSUVASTATIN CALCIUM 20 MG: 20 | 90 days supply | Qty: 90 | Fill #1

## 2016-08-11 ENCOUNTER — Ambulatory Visit (INDEPENDENT_AMBULATORY_CARE_PROVIDER_SITE_OTHER): Payer: 59

## 2016-08-11 ENCOUNTER — Ambulatory Visit
Admission: EM | Admit: 2016-08-11 | Discharge: 2016-08-11 | Disposition: A | Discharge status: Home / Self Care | Payer: 59 | Attending: Family Medicine | Admitting: Family Medicine

## 2016-08-11 DIAGNOSIS — J441 Chronic obstructive pulmonary disease with (acute) exacerbation: Secondary | ICD-10-CM

## 2016-08-11 DIAGNOSIS — J209 Acute bronchitis, unspecified: Secondary | ICD-10-CM

## 2016-08-11 DIAGNOSIS — R05 Cough: Secondary | ICD-10-CM | POA: Diagnosis not present

## 2016-08-11 MED ORDER — AZITHROMYCIN 500 MG PO TABS: ORAL_TABLET | ORAL | 0 refills | Status: DC

## 2016-08-11 MED ORDER — METHYLPREDNISOLONE SODIUM SUCC 125 MG IJ SOLR
125.0000 mg | Freq: Once | INTRAMUSCULAR | Status: AC
  Administered 2016-08-11: 125 mg via INTRAMUSCULAR

## 2016-08-11 MED ORDER — PREDNISONE 10 MG (21) PO TBPK: ORAL_TABLET | ORAL | 0 refills | Status: DC

## 2016-08-11 MED ORDER — BENZONATATE 200 MG PO CAPS: 200.0000 mg | ORAL_CAPSULE | Freq: Three times a day (TID) | ORAL | 0 refills | Status: DC | PRN

## 2016-08-11 NOTE — ED Triage Notes (Signed)
Patient complains of cough and congestion x 1 month. Patient states that she had been improving but worsened yesterday. Patient states that she is having a deep cough with green phlegm. Patient states that she did not sleep secondary to cough. Patient reports that she has tried zyrtec and benadryl with little relief. Patient states that she has also been using her inhalers. Patient states that she is having back pain at her shoulder blades from the cough.

## 2016-08-11 NOTE — ED Provider Notes (Signed)
[facility]    CSN: [id number] Arrival date & time: 08/11/16  [id number]     History   Chief Complaint Chief Complaint  Patient presents with  . Cough    HPI Laura Gillespie is a 55 y.o. female.   Sincerely because of shortness of breath pain in the left back and productive cough. She states she's had this cough now for about 4-5 weeks. Never was productive she used over-the-counter medications and treatments to get things improved. Never saw Dr. while this was going on. Finally about 2 weeks ago the cough stopped but then yesterday it came back with what she states was a vengeance. She reports coughing up greenish thick phlegm increase shortness of breath and increased wheezing. She does smoke she does have a history of COPD and she's had probable lung removed because of nodularity and fibrosis which I think was old aspiration injury as a child. She states that she has inhalers at home and nebulizer treatments at home she does not need a nebulizer treatment here but she does states that she has been on prednisone before for things like this and think she needs to go back on prednisone. She doesn't usually use cough medicine because she doesn't tolerate narcotics they mostly are usually will break her out in hives. Of course as stated above she does smoke. She's had a coronary coronary artery disease history she's had acute non-ST segment elevation marking her myocardial infarction, hyperlipidemia and hypertension. She has had carpal tunnel surgery, thoracotomy, tonsillectomy tubal ligation cholecystectomy and coronary angioplasty. Mother with hypertension hyperlipidemia father also with hypertension hyperlipidemia and heart disease.   The history is provided by the patient. No language interpreter was used.  Cough  Cough characteristics:  Productive Sputum characteristics:  Green Severity:  Moderate Onset quality:  Unable to specify Timing:  Constant Progression:   Worsening Chronicity:  Recurrent Smoker: yes   Context: fumes, smoke exposure and upper respiratory infection   Relieved by:  Nothing Worsened by:  Smoking and deep breathing Ineffective treatments:  Beta-agonist inhaler Associated symptoms: chills, shortness of breath and wheezing   Associated symptoms: no chest pain, no ear fullness, no ear pain, no fever, no headaches, no myalgias, no rash, no rhinorrhea, no sinus congestion and no sore throat     Past Medical History:  Diagnosis Date  . Acute non-ST segment elevation myocardial infarction ([place])   . Coronary artery disease 12/2013   s/p bare metal stent to distal RCA  . Hyperlipidemia   . Hypertension     Patient Active Problem List   Diagnosis Date Noted  . Morbid obesity ([place]) 10/29/2015  . Myalgia 07/17/2014  . SOB (shortness of breath) 07/17/2014  . S/P right coronary artery (RCA) stent placement 01/17/2014  . Coronary atherosclerosis of native coronary artery 01/17/2014  . NSTEMI (non-ST elevated myocardial infarction) ([place]) 01/17/2014  . Hyperlipidemia 01/17/2014  . Smoker 01/17/2014  . Upper respiratory infection 01/17/2014    Past Surgical History:  Procedure Laterality Date  . CARDIAC CATHETERIZATION    . CARPAL TUNNEL RELEASE     both hand  . CHOLECYSTECTOMY    . CORONARY ANGIOPLASTY  12/2013   stent placement to the distal RCA  . LUNG BIOPSY    . TONSILLECTOMY    . TUBAL LIGATION      OB History    No data available       Home Medications    Prior to Admission medications   Medication Sig Start  Date End Date Taking? Authorizing Provider  ALPRAZolam Duanne Moron) 0.5 MG tablet Take 0.25 mg by mouth daily as needed for anxiety.  01/10/14  Yes Historical Provider, MD  aspirin EC 81 MG tablet Take 1 tablet (81 mg total) by mouth daily. 01/17/14  Yes Minna Merritts, MD  CRESTOR 20 MG tablet Take 20 mg by mouth daily.  01/10/14  Yes Historical Provider, MD  metoprolol tartrate (LOPRESSOR) 25 MG tablet Take 25  mg by mouth 2 (two) times daily.  01/10/14  Yes Historical Provider, MD  acetaminophen (TYLENOL) 500 MG tablet Take 1,000 mg by mouth daily as needed for moderate pain.    Historical Provider, MD  azithromycin (ZITHROMAX) 500 MG tablet 1 tablet a day for the next 5 days 08/11/16   Frederich Cha, MD  benzonatate (TESSALON) 200 MG capsule Take 1 capsule (200 mg total) by mouth 3 (three) times daily as needed. 08/11/16   Frederich Cha, MD  NITROSTAT 0.4 MG SL tablet Place 0.4 mg under the tongue every 5 (five) minutes as needed.  01/10/14   Historical Provider, MD  predniSONE (STERAPRED UNI-PAK 21 TAB) 10 MG (21) TBPK tablet Sig 6 tablet day 1, 5 tablets day 2, 4 tablets day 3,,3tablets day 4, 2 tablets day 5, 1 tablet day 6 take all tablets orally 08/11/16   Frederich Cha, MD  traMADol (ULTRAM) 50 MG tablet Take 50 mg by mouth every 6 (six) hours as needed for moderate pain.  01/10/14   Historical Provider, MD    Family History Family History  Problem Relation Age of Onset  . Hypertension Mother   . Hyperlipidemia Mother   . Hypertension Father   . Hyperlipidemia Father   . Heart disease Father   . Heart attack Father 40    Social History Social History  Substance Use Topics  . Smoking status: Current Every Day Smoker    Packs/day: 1.00    Years: 35.00    Types: Cigarettes  . Smokeless tobacco: Never Used  . Alcohol use No     Allergies   Percocet [oxycodone-acetaminophen]   Review of Systems Review of Systems  Constitutional: Positive for chills. Negative for fever.  HENT: Negative for ear pain, rhinorrhea and sore throat.   Respiratory: Positive for cough, shortness of breath and wheezing.   Cardiovascular: Negative for chest pain.  Musculoskeletal: Negative for myalgias.  Skin: Negative for rash.  Neurological: Negative for headaches.  All other systems reviewed and are negative.    Physical Exam Triage Vital Signs ED Triage Vitals  Enc Vitals Group     BP 08/11/16 1017  (!) 143/79     Pulse Rate 08/11/16 1017 85     Resp 08/11/16 1017 17     Temp 08/11/16 1017 98.2 F (36.8 C)     Temp Source 08/11/16 1017 Oral     SpO2 08/11/16 1017 93 %     Weight 08/11/16 1015 240 lb (108.9 kg)     Height 08/11/16 1015 5\' 3"  (1.6 m)     Head Circumference --      Peak Flow --      Pain Score 08/11/16 1016 5     Pain Loc --      Pain Edu? --      Excl. in [place]? --    No data found.   Updated Vital Signs BP (!) 143/79 (BP Location: Left Arm)   Pulse 85   Temp 98.2 F (36.8 C) (Oral)   Resp  17   Ht 5\' 3"  (1.6 m)   Wt 240 lb (108.9 kg)   SpO2 93%   BMI 42.51 kg/m   Visual Acuity Right Eye Distance:   Left Eye Distance:   Bilateral Distance:    Right Eye Near:   Left Eye Near:    Bilateral Near:     Physical Exam  Constitutional: She appears well-developed and well-nourished. No distress.  HENT:  Head: Normocephalic.  Right Ear: External ear normal.  Left Ear: External ear normal.  Mouth/Throat: Oropharynx is clear and moist.  Eyes: Pupils are equal, round, and reactive to light.  Neck: Normal range of motion. No tracheal deviation present.  Cardiovascular: Normal rate, regular rhythm and normal heart sounds.   Pulmonary/Chest: Effort normal. She has wheezes.  Musculoskeletal: Normal range of motion.  Neurological: She is alert.  Skin: Skin is warm. She is not diaphoretic.  Psychiatric: She has a normal mood and affect.  Vitals reviewed.    UC Treatments / Results  Labs (all labs ordered are listed, but only abnormal results are displayed) Labs Reviewed - No data to display  EKG  EKG Interpretation None       Radiology Dg Chest 2 View  Result Date: 08/11/2016 CLINICAL DATA:  Productive cough for the past month with some recent improvement but symptoms have recurrent over the past week. Patient porch pain under the shoulder blade for 3 days. History of COPD, current smoker, previous partial right lobectomy due to nodules.  Previous MI with stent placement. EXAM: CHEST  2 VIEW COMPARISON:  PA and lateral chest x-ray of Jan 14, 2016 FINDINGS: The left lung is well-expanded and clear. On the right there is patchy interstitial density in the mid and lower lung which is stable and compatible with scarring. There is no alveolar infiltrate or pleural effusion. There is a stable approximately 5 mm diameter nodule in the right mid lung. The heart and pulmonary vascularity are normal. There is faint calcification in the wall of the aortic arch. There are postsurgical changes associated with the posterolateral aspect of the right fifth rib. Elsewhere the bony structures are unremarkable. There is a surgical clip project which projects posteriorly in the upper abdominal cavity. IMPRESSION: There is no acute cardiopulmonary abnormality. Thoracic aortic atherosclerosis. Electronically Signed   By: David  Martinique M.D.   On: 08/11/2016 12:07    Procedures Procedures (including critical care time)  Medications Ordered in UC Medications  methylPREDNISolone sodium succinate (SOLU-MEDROL) 125 mg/2 mL injection 125 mg (125 mg Intramuscular Given 08/11/16 1149)     Initial Impression / Assessment and Plan / UC Course  I have reviewed the triage vital signs and the nursing notes.  Pertinent labs & imaging results that were available during my care of the patient were reviewed by me and considered in my medical decision making (see chart for details).  Clinical Course     Albuterol at home and nebulizing solution at home. Will obtain chest x-ray to make sure she does not have pneumonia if she has pneumonia and will place on Levaquin if this negative for pneumonia we'll place on full strength Zithromax she states she is not at work note since she is starting her vacation   Final Clinical Impressions(s) / UC Diagnoses   Final diagnoses:  COPD with acute exacerbation ([place])  Bronchospasm with bronchitis, acute    New  Prescriptions New Prescriptions   AZITHROMYCIN (ZITHROMAX) 500 MG TABLET    1 tablet a day for the  next 5 days   BENZONATATE (TESSALON) 200 MG CAPSULE    Take 1 capsule (200 mg total) by mouth 3 (three) times daily as needed.   PREDNISONE (STERAPRED UNI-PAK 21 TAB) 10 MG (21) TBPK TABLET    Sig 6 tablet day 1, 5 tablets day 2, 4 tablets day 3,,3tablets day 4, 2 tablets day 5, 1 tablet day 6 take all tablets orally    No acute findings on chest x-ray will place patient on Tessalon Perles 200 mg 3 times a day when necessary Zithromax 500 mg daily for 5 days and will place on 6 day course of prednisone she has been given a shot of Solu-Medrol IM. Follow-up PCP in 2-3 weeks not better.   Note: This dictation was prepared with Dragon dictation along with smaller phrase technology. Any transcriptional errors that result from this process are unintentional.   Frederich Cha, MD 08/11/16 1218

## 2016-11-20 ENCOUNTER — Ambulatory Visit
Admission: EM | Admit: 2016-11-20 | Discharge: 2016-11-20 | Disposition: A | Discharge status: Home / Self Care | Payer: 59 | Attending: Family Medicine | Admitting: Family Medicine

## 2016-11-20 ENCOUNTER — Encounter: Payer: Self-pay | Admitting: *Deleted

## 2016-11-20 DIAGNOSIS — J208 Acute bronchitis due to other specified organisms: Secondary | ICD-10-CM | POA: Diagnosis not present

## 2016-11-20 DIAGNOSIS — J069 Acute upper respiratory infection, unspecified: Secondary | ICD-10-CM

## 2016-11-20 DIAGNOSIS — J209 Acute bronchitis, unspecified: Secondary | ICD-10-CM

## 2016-11-20 MED ORDER — METHYLPREDNISOLONE SODIUM SUCC 125 MG IJ SOLR
125.0000 mg | Freq: Once | INTRAMUSCULAR | Status: AC
  Administered 2016-11-20: 125 mg via INTRAMUSCULAR

## 2016-11-20 MED ORDER — PREDNISONE 10 MG (21) PO TBPK: ORAL_TABLET | ORAL | 0 refills | Status: DC

## 2016-11-20 MED ORDER — LEVOFLOXACIN 500 MG PO TABS: 500.0000 mg | ORAL_TABLET | Freq: Every day | ORAL | 0 refills | Status: DC

## 2016-11-20 NOTE — ED Provider Notes (Signed)
[facility]    CSN: [id number] Arrival date & time: 11/20/16  1718     History   Chief Complaint Chief Complaint  Patient presents with  . Cough  . Generalized Body Aches    HPI Tanayah L Alexopoulos is a 56 y.o. female.   Patient is a 56 year old white female with shortness of breath. He said a history of exacerbation and recurrent bronchitis. She's never had a diagnosis of COPD that I can see but requires prednisone she has exacerbations. While she stop smoking several years ago she is smoking again and states that she's cut back on his smoking but she still has had multiple exacerbations in the last few months. She reports this was started on Monday progressively getting worse increasing shortness of breath. She declined aerosol treatment here states she has inhaler states try to get into see her PCP today but was unable to get in so she came here instead. She has history of hypertension hyperlipidemia obesity she's had a non-STEMI she's had catheterization and she has history of recurrent mild. Mother and father both had hypertension and hyperlipidemia. She's had carpal tunnel surgery cholecystectomy coronary angioplasty lung biopsy tonsillectomy and tubal ligation. She is allergic to Percocet and morphine.   The history is provided by the patient. No language interpreter was used.  Cough  Cough characteristics:  Productive Sputum characteristics:  Yellow Severity:  Moderate Duration:  4 days Timing:  Constant Progression:  Worsening Chronicity:  Recurrent Smoker: yes   Context: smoke exposure and upper respiratory infection   Relieved by:  Nothing Worsened by:  Activity Ineffective treatments:  None tried Associated symptoms: shortness of breath and wheezing     Past Medical History:  Diagnosis Date  . Acute non-ST segment elevation myocardial infarction ([place])   . Coronary artery disease 12/2013   s/p bare metal stent to distal RCA  . Hyperlipidemia   .  Hypertension     Patient Active Problem List   Diagnosis Date Noted  . Morbid obesity ([place]) 10/29/2015  . Myalgia 07/17/2014  . SOB (shortness of breath) 07/17/2014  . S/P right coronary artery (RCA) stent placement 01/17/2014  . Coronary atherosclerosis of native coronary artery 01/17/2014  . NSTEMI (non-ST elevated myocardial infarction) ([place]) 01/17/2014  . Hyperlipidemia 01/17/2014  . Smoker 01/17/2014  . Upper respiratory infection 01/17/2014    Past Surgical History:  Procedure Laterality Date  . CARDIAC CATHETERIZATION    . CARPAL TUNNEL RELEASE     both hand  . CHOLECYSTECTOMY    . CORONARY ANGIOPLASTY  12/2013   stent placement to the distal RCA  . LUNG BIOPSY    . TONSILLECTOMY    . TUBAL LIGATION      OB History    No data available       Home Medications    Prior to Admission medications   Medication Sig Start Date End Date Taking? Authorizing Provider  acetaminophen (TYLENOL) 500 MG tablet Take 1,000 mg by mouth daily as needed for moderate pain.    Historical Provider, MD  ALPRAZolam Duanne Moron) 0.5 MG tablet Take 0.25 mg by mouth daily as needed for anxiety.  01/10/14   Historical Provider, MD  aspirin EC 81 MG tablet Take 1 tablet (81 mg total) by mouth daily. 01/17/14   Minna Merritts, MD  azithromycin (ZITHROMAX) 500 MG tablet 1 tablet a day for the next 5 days 08/11/16   Frederich Cha, MD  benzonatate (TESSALON) 200 MG capsule Take 1 capsule (  200 mg total) by mouth 3 (three) times daily as needed. 08/11/16   Frederich Cha, MD  CRESTOR 20 MG tablet Take 20 mg by mouth daily.  01/10/14   Historical Provider, MD  levofloxacin (LEVAQUIN) 500 MG tablet Take 1 tablet (500 mg total) by mouth daily. 11/20/16   Frederich Cha, MD  metoprolol tartrate (LOPRESSOR) 25 MG tablet Take 25 mg by mouth 2 (two) times daily.  01/10/14   Historical Provider, MD  NITROSTAT 0.4 MG SL tablet Place 0.4 mg under the tongue every 5 (five) minutes as needed.  01/10/14   Historical Provider, MD    predniSONE (STERAPRED UNI-PAK 21 TAB) 10 MG (21) TBPK tablet Sig 6 tablet day 1, 5 tablets day 2, 4 tablets day 3,,3tablets day 4, 2 tablets day 5, 1 tablet day 6 take all tablets orally 08/11/16   Frederich Cha, MD  predniSONE (STERAPRED UNI-PAK 21 TAB) 10 MG (21) TBPK tablet Sig 6 tablet day 1, 5 tablets day 2, 4 tablets day 3,,3tablets day 4, 2 tablets day 5, 1 tablet day 6 take all tablets orally 11/20/16   Frederich Cha, MD  traMADol (ULTRAM) 50 MG tablet Take 50 mg by mouth every 6 (six) hours as needed for moderate pain.  01/10/14   Historical Provider, MD    Family History Family History  Problem Relation Age of Onset  . Hypertension Mother   . Hyperlipidemia Mother   . Hypertension Father   . Hyperlipidemia Father   . Heart disease Father   . Heart attack Father 40    Social History Social History  Substance Use Topics  . Smoking status: Current Every Day Smoker    Packs/day: 1.00    Years: 35.00    Types: Cigarettes  . Smokeless tobacco: Never Used  . Alcohol use No     Allergies   Morphine and related and Percocet [oxycodone-acetaminophen]   Review of Systems Review of Systems  HENT: Positive for congestion.   Respiratory: Positive for cough, chest tightness, shortness of breath and wheezing.      Physical Exam Triage Vital Signs ED Triage Vitals  Enc Vitals Group     BP 11/20/16 1733 (!) 186/90     Pulse Rate 11/20/16 1733 84     Resp 11/20/16 1733 16     Temp 11/20/16 1733 98.1 F (36.7 C)     Temp Source 11/20/16 1733 Oral     SpO2 11/20/16 1733 95 %     Weight 11/20/16 1734 260 lb (117.9 kg)     Height 11/20/16 1734 5\' 3"  (1.6 m)     Head Circumference --      Peak Flow --      Pain Score 11/20/16 1911 0     Pain Loc --      Pain Edu? --      Excl. in [place]? --    No data found.   Updated Vital Signs BP (!) 186/90 (BP Location: Left Arm)   Pulse 84   Temp 98.1 F (36.7 C) (Oral)   Resp 16   Ht 5\' 3"  (1.6 m)   Wt 260 lb (117.9 kg)   SpO2  95%   BMI 46.06 kg/m   Visual Acuity Right Eye Distance:   Left Eye Distance:   Bilateral Distance:    Right Eye Near:   Left Eye Near:    Bilateral Near:     Physical Exam  Constitutional: She appears well-developed and well-nourished.  HENT:  Head:  Normocephalic and atraumatic.  Right Ear: External ear normal.  Left Ear: External ear normal.  Mouth/Throat: Oropharynx is clear and moist.  Eyes: Conjunctivae and EOM are normal. Pupils are equal, round, and reactive to light.  Neck: Normal range of motion. Neck supple.  Cardiovascular: Normal rate and regular rhythm.   Pulmonary/Chest: She has decreased breath sounds. She has wheezes.  Musculoskeletal: Normal range of motion.  Neurological: She is alert.  Skin: Skin is warm.  Psychiatric: She has a normal mood and affect.  Vitals reviewed.    UC Treatments / Results  Labs (all labs ordered are listed, but only abnormal results are displayed) Labs Reviewed - No data to display  EKG  EKG Interpretation None       Radiology No results found.  Procedures Procedures (including critical care time)  Medications Ordered in UC Medications  methylPREDNISolone sodium succinate (SOLU-MEDROL) 125 mg/2 mL injection 125 mg (125 mg Intramuscular Given 11/20/16 1854)     Initial Impression / Assessment and Plan / UC Course  I have reviewed the triage vital signs and the nursing notes.  Pertinent labs & imaging results that were available during my care of the patient were reviewed by me and considered in my medical decision making (see chart for details).     Patient does not have a diagnosis of COPD but of has a presentation similar to his COPD like patient. She's run me that she does not like cough syrups she continues to smoke. She wants her Levaquin and she wants her prednisone. She does not need her inhaler or nebulizer she has those at home according to her. She's heading to [place] because her mother has just  recently died and she may have to [place]. Will give her Levaquin 500 mg 1 tablet day for 10 days 6 day course of prednisone and will Mr. Solu-Medrol 125 mg IM per her request.  Final Clinical Impressions(s) / UC Diagnoses   Final diagnoses:  Upper respiratory tract infection, unspecified type  Acute bronchitis with bronchospasm    New Prescriptions New Prescriptions   LEVOFLOXACIN (LEVAQUIN) 500 MG TABLET    Take 1 tablet (500 mg total) by mouth daily.   PREDNISONE (STERAPRED UNI-PAK 21 TAB) 10 MG (21) TBPK TABLET    Sig 6 tablet day 1, 5 tablets day 2, 4 tablets day 3,,3tablets day 4, 2 tablets day 5, 1 tablet day 6 take all tablets orally     Frederich Cha, MD 11/20/16 1920

## 2016-11-20 NOTE — ED Triage Notes (Signed)
Productive cough- green, chest congestion, chest soreness x1 week. Denies fever.

## 2016-12-01 MED FILL — ROSUVASTATIN CALCIUM 20 MG: 20 | 90 days supply | Qty: 90 | Fill #2

## 2017-01-06 ENCOUNTER — Telehealth: Payer: Self-pay | Admitting: Cardiovascular Disease

## 2017-01-06 NOTE — Telephone Encounter (Signed)
3 attempts to schedule fu from recall   [place] 12 month fu per ckout 10/29/15  Deleting recall

## 2017-01-18 ENCOUNTER — Ambulatory Visit
Admission: EM | Admit: 2017-01-18 | Discharge: 2017-01-18 | Disposition: A | Discharge status: Home / Self Care | Payer: 59 | Attending: Family Medicine | Admitting: Family Medicine

## 2017-01-18 DIAGNOSIS — J441 Chronic obstructive pulmonary disease with (acute) exacerbation: Secondary | ICD-10-CM | POA: Diagnosis not present

## 2017-01-18 MED ORDER — ALBUTEROL SULFATE HFA 108 (90 BASE) MCG/ACT IN AERS: 1.0000 | INHALATION_SPRAY | Freq: Four times a day (QID) | RESPIRATORY_TRACT | 0 refills | Status: DC | PRN

## 2017-01-18 MED ORDER — PREDNISONE 20 MG PO TABS: ORAL_TABLET | ORAL | 0 refills | Status: DC

## 2017-01-18 MED ORDER — LEVOFLOXACIN 500 MG PO TABS: 500.0000 mg | ORAL_TABLET | Freq: Every day | ORAL | 0 refills | Status: DC

## 2017-01-18 NOTE — ED Triage Notes (Signed)
Pt started yesterday with cough, stuffy nose and nasal congestion, scratchy throat, and chest tight with cough. Reports fever during the night of 102.0. Pain all over 5/10

## 2017-01-18 NOTE — ED Provider Notes (Signed)
[facility]    CSN: [id number] Arrival date & time: 01/18/17  0806     History   Chief Complaint Chief Complaint  Patient presents with  . Nasal Congestion    HPI Theta L Cuevas is a 56 y.o. female.   The history is provided by the patient.  URI  Presenting symptoms: congestion, cough and fever   Severity:  Moderate Onset quality:  Sudden Duration:  2 days Timing:  Constant Progression:  Worsening Chronicity:  New Relieved by:  Nothing Worsened by:  Nothing Ineffective treatments:  Nebulizer treatments Associated symptoms: wheezing   Risk factors: chronic cardiac disease and chronic respiratory disease (copd)   Risk factors: not elderly, no chronic kidney disease, no diabetes mellitus, no immunosuppression, no recent illness, no recent travel and no sick contacts     Past Medical History:  Diagnosis Date  . Acute non-ST segment elevation myocardial infarction ([place])   . Coronary artery disease 12/2013   s/p bare metal stent to distal RCA  . Hyperlipidemia   . Hypertension     Patient Active Problem List   Diagnosis Date Noted  . Morbid obesity ([place]) 10/29/2015  . Myalgia 07/17/2014  . SOB (shortness of breath) 07/17/2014  . S/P right coronary artery (RCA) stent placement 01/17/2014  . Coronary atherosclerosis of native coronary artery 01/17/2014  . NSTEMI (non-ST elevated myocardial infarction) ([place]) 01/17/2014  . Hyperlipidemia 01/17/2014  . Smoker 01/17/2014  . Upper respiratory infection 01/17/2014    Past Surgical History:  Procedure Laterality Date  . CARDIAC CATHETERIZATION    . CARPAL TUNNEL RELEASE     both hand  . CHOLECYSTECTOMY    . CORONARY ANGIOPLASTY  12/2013   stent placement to the distal RCA  . LUNG BIOPSY    . TONSILLECTOMY    . TUBAL LIGATION      OB History    No data available       Home Medications    Prior to Admission medications   Medication Sig Start Date End Date Taking? Authorizing Provider    acetaminophen (TYLENOL) 500 MG tablet Take 1,000 mg by mouth daily as needed for moderate pain.    [provider]  albuterol (PROVENTIL HFA;VENTOLIN HFA) 108 (90 Base) MCG/ACT inhaler Inhale 1-2 puffs into the lungs every 6 (six) hours as needed for wheezing or shortness of breath. 01/18/17   Norval Gable, MD  ALPRAZolam Duanne Moron) 0.5 MG tablet Take 0.25 mg by mouth daily as needed for anxiety.  01/10/14   [provider]  aspirin EC 81 MG tablet Take 1 tablet (81 mg total) by mouth daily. 01/17/14   Minna Merritts, MD  azithromycin (ZITHROMAX) 500 MG tablet 1 tablet a day for the next 5 days 08/11/16   Frederich Cha, MD  benzonatate (TESSALON) 200 MG capsule Take 1 capsule (200 mg total) by mouth 3 (three) times daily as needed. 08/11/16   Frederich Cha, MD  CRESTOR 20 MG tablet Take 20 mg by mouth daily.  01/10/14   [provider]  levofloxacin (LEVAQUIN) 500 MG tablet Take 1 tablet (500 mg total) by mouth daily. 01/18/17   Norval Gable, MD  metoprolol tartrate (LOPRESSOR) 25 MG tablet Take 25 mg by mouth 2 (two) times daily.  01/10/14   [provider]  NITROSTAT 0.4 MG SL tablet Place 0.4 mg under the tongue every 5 (five) minutes as needed.  01/10/14   [provider]  predniSONE (DELTASONE) 20 MG tablet 3 tabs po  qd for 2 days, then 2 tabs po qd for 3 days, then 1 tab po qd for 3 days, then half a tab po qd for 2 days 01/18/17   Norval Gable, MD  traMADol (ULTRAM) 50 MG tablet Take 50 mg by mouth every 6 (six) hours as needed for moderate pain.  01/10/14   [provider]    Family History Family History  Problem Relation Age of Onset  . Hypertension Mother   . Hyperlipidemia Mother   . Hypertension Father   . Hyperlipidemia Father   . Heart disease Father   . Heart attack Father 58    Social History Social History  Substance Use Topics  . Smoking status: Current Every Day Smoker    Packs/day: 1.00    Years: 35.00    Types:  Cigarettes  . Smokeless tobacco: Never Used  . Alcohol use No     Allergies   Morphine and related and Percocet [oxycodone-acetaminophen]   Review of Systems Review of Systems  Constitutional: Positive for fever.  HENT: Positive for congestion.   Respiratory: Positive for cough and wheezing.      Physical Exam Triage Vital Signs ED Triage Vitals  Enc Vitals Group     BP 01/18/17 0849 (!) 151/80     Pulse Rate 01/18/17 0849 99     Resp 01/18/17 0849 (!) 22     Temp 01/18/17 0849 99.1 F (37.3 C)     Temp Source 01/18/17 0849 Oral     SpO2 01/18/17 0849 94 %     Weight 01/18/17 0851 240 lb (108.9 kg)     Height 01/18/17 0851 5\' 2"  (1.575 m)     Head Circumference --      Peak Flow --      Pain Score 01/18/17 0852 5     Pain Loc --      Pain Edu? --      Excl. in [place]? --    No data found.   Updated Vital Signs BP (!) 151/80 (BP Location: Left Arm)   Pulse 99   Temp 99.1 F (37.3 C) (Oral)   Resp (!) 22   Ht 5\' 2"  (1.575 m)   Wt 240 lb (108.9 kg)   SpO2 95%   BMI 43.90 kg/m   Visual Acuity Right Eye Distance:   Left Eye Distance:   Bilateral Distance:    Right Eye Near:   Left Eye Near:    Bilateral Near:     Physical Exam  Constitutional: She appears well-developed and well-nourished. No distress.  HENT:  Head: Normocephalic and atraumatic.  Right Ear: Tympanic membrane, external ear and ear canal normal.  Left Ear: Tympanic membrane, external ear and ear canal normal.  Nose: No mucosal edema, rhinorrhea, nose lacerations, sinus tenderness, nasal deformity, septal deviation or nasal septal hematoma. No epistaxis.  No foreign bodies. Right sinus exhibits no maxillary sinus tenderness and no frontal sinus tenderness. Left sinus exhibits no maxillary sinus tenderness and no frontal sinus tenderness.  Mouth/Throat: Uvula is midline, oropharynx is clear and moist and mucous membranes are normal. No oropharyngeal exudate.  Eyes: Conjunctivae and EOM are  normal. Pupils are equal, round, and reactive to light. Right eye exhibits no discharge. Left eye exhibits no discharge. No scleral icterus.  Neck: Normal range of motion. Neck supple. No thyromegaly present.  Cardiovascular: Normal rate, regular rhythm and normal heart sounds.   Pulmonary/Chest: Effort normal. No respiratory distress. She has wheezes (and rhonchi diffusely).  She has no rales.  Lymphadenopathy:    She has no cervical adenopathy.  Skin: She is not diaphoretic.  Nursing note and vitals reviewed.    UC Treatments / Results  Labs (all labs ordered are listed, but only abnormal results are displayed) Labs Reviewed - No data to display  EKG  EKG Interpretation None       Radiology No results found.  Procedures Procedures (including critical care time)  Medications Ordered in UC Medications - No data to display   Initial Impression / Assessment and Plan / UC Course  I have reviewed the triage vital signs and the nursing notes.  Pertinent labs & imaging results that were available during my care of the patient were reviewed by me and considered in my medical decision making (see chart for details).       Final Clinical Impressions(s) / UC Diagnoses   Final diagnoses:  COPD exacerbation ([place])    New Prescriptions Discharge Medication List as of 01/18/2017  9:36 AM    START taking these medications   Details  albuterol (PROVENTIL HFA;VENTOLIN HFA) 108 (90 Base) MCG/ACT inhaler Inhale 1-2 puffs into the lungs every 6 (six) hours as needed for wheezing or shortness of breath., Starting Sun 01/18/2017, Normal    predniSONE (DELTASONE) 20 MG tablet 3 tabs po qd for 2 days, then 2 tabs po qd for 3 days, then 1 tab po qd for 3 days, then half a tab po qd for 2 days, Normal       1. diagnosis reviewed with patient 2. rx as per orders above; reviewed possible side effects, interactions, risks and benefits  3. Recommend supportive treatment with rest,  fluids 4. Follow-up prn if symptoms worsen or don't improve   Norval Gable, MD 01/18/17 1029

## 2017-01-28 MED FILL — METOPROLOL TARTRATE 25 MG T: 25 | 90 days supply | Qty: 180 | Fill #1

## 2017-02-24 MED FILL — ROSUVASTATIN CALCIUM 20 MG: 20 | 90 days supply | Qty: 90 | Fill #3

## 2017-07-03 MED FILL — ROSUVASTATIN CALCIUM 20 MG: 20 | 90 days supply | Qty: 90 | Fill #0

## 2017-07-06 MED FILL — METOPROLOL TARTRATE 25 MG T: 25 | 90 days supply | Qty: 180 | Fill #2

## 2017-08-04 DIAGNOSIS — M9901 Segmental and somatic dysfunction of cervical region: Secondary | ICD-10-CM | POA: Diagnosis not present

## 2017-08-04 DIAGNOSIS — M5412 Radiculopathy, cervical region: Secondary | ICD-10-CM | POA: Diagnosis not present

## 2017-08-04 DIAGNOSIS — M546 Pain in thoracic spine: Secondary | ICD-10-CM | POA: Diagnosis not present

## 2017-08-04 DIAGNOSIS — M9902 Segmental and somatic dysfunction of thoracic region: Secondary | ICD-10-CM | POA: Diagnosis not present

## 2017-08-06 DIAGNOSIS — M5412 Radiculopathy, cervical region: Secondary | ICD-10-CM | POA: Diagnosis not present

## 2017-08-06 DIAGNOSIS — M9901 Segmental and somatic dysfunction of cervical region: Secondary | ICD-10-CM | POA: Diagnosis not present

## 2017-08-06 DIAGNOSIS — M9902 Segmental and somatic dysfunction of thoracic region: Secondary | ICD-10-CM | POA: Diagnosis not present

## 2017-08-06 DIAGNOSIS — M546 Pain in thoracic spine: Secondary | ICD-10-CM | POA: Diagnosis not present

## 2017-08-11 DIAGNOSIS — M9902 Segmental and somatic dysfunction of thoracic region: Secondary | ICD-10-CM | POA: Diagnosis not present

## 2017-08-11 DIAGNOSIS — M546 Pain in thoracic spine: Secondary | ICD-10-CM | POA: Diagnosis not present

## 2017-08-11 DIAGNOSIS — M9901 Segmental and somatic dysfunction of cervical region: Secondary | ICD-10-CM | POA: Diagnosis not present

## 2017-08-11 DIAGNOSIS — M5412 Radiculopathy, cervical region: Secondary | ICD-10-CM | POA: Diagnosis not present

## 2017-09-09 DIAGNOSIS — M9902 Segmental and somatic dysfunction of thoracic region: Secondary | ICD-10-CM | POA: Diagnosis not present

## 2017-09-09 DIAGNOSIS — M5412 Radiculopathy, cervical region: Secondary | ICD-10-CM | POA: Diagnosis not present

## 2017-09-09 DIAGNOSIS — M9901 Segmental and somatic dysfunction of cervical region: Secondary | ICD-10-CM | POA: Diagnosis not present

## 2017-09-09 DIAGNOSIS — M546 Pain in thoracic spine: Secondary | ICD-10-CM | POA: Diagnosis not present

## 2017-10-06 MED FILL — ROSUVASTATIN CALCIUM 20 MG: 20 | 90 days supply | Qty: 90 | Fill #1

## 2017-10-16 MED FILL — PROAIR HFA 90 MCG INHALER: 108 (90 BAS | 75 days supply | Qty: 26 | Fill #0

## 2017-10-26 DIAGNOSIS — E119 Type 2 diabetes mellitus without complications: Secondary | ICD-10-CM | POA: Diagnosis not present

## 2017-10-26 DIAGNOSIS — J449 Chronic obstructive pulmonary disease, unspecified: Secondary | ICD-10-CM | POA: Diagnosis not present

## 2017-10-26 DIAGNOSIS — I10 Essential (primary) hypertension: Secondary | ICD-10-CM | POA: Diagnosis not present

## 2017-10-26 DIAGNOSIS — I2510 Atherosclerotic heart disease of native coronary artery without angina pectoris: Secondary | ICD-10-CM | POA: Diagnosis not present

## 2017-10-27 ENCOUNTER — Other Ambulatory Visit (HOSPITAL_COMMUNITY)
Admission: RE | Admit: 2017-10-27 | Discharge: 2017-10-27 | Disposition: A | Discharge status: Home / Self Care | Payer: 59 | Source: Other Acute Inpatient Hospital | Attending: Pulmonary Disease | Admitting: Pulmonary Disease

## 2017-10-27 DIAGNOSIS — J449 Chronic obstructive pulmonary disease, unspecified: Secondary | ICD-10-CM | POA: Insufficient documentation

## 2017-10-27 DIAGNOSIS — F419 Anxiety disorder, unspecified: Secondary | ICD-10-CM | POA: Insufficient documentation

## 2017-10-27 DIAGNOSIS — I2510 Atherosclerotic heart disease of native coronary artery without angina pectoris: Secondary | ICD-10-CM | POA: Insufficient documentation

## 2017-10-27 DIAGNOSIS — I10 Essential (primary) hypertension: Secondary | ICD-10-CM | POA: Diagnosis not present

## 2017-10-27 LAB — COMPREHENSIVE METABOLIC PANEL
ALT: 15 U/L (ref 14–54)
AST: 20 U/L (ref 15–41)
Albumin: 4.1 g/dL (ref 3.5–5.0)
Alkaline Phosphatase: 120 U/L (ref 38–126)
Anion gap: 10 (ref 5–15)
BUN: 16 mg/dL (ref 6–20)
CALCIUM: 9.1 mg/dL (ref 8.9–10.3)
CO2: 25 mmol/L (ref 22–32)
CREATININE: 0.8 mg/dL (ref 0.44–1.00)
Chloride: 104 mmol/L (ref 101–111)
Glucose, Bld: 110 mg/dL — ABNORMAL HIGH (ref 65–99)
Potassium: 4.4 mmol/L (ref 3.5–5.1)
Sodium: 139 mmol/L (ref 135–145)
Total Bilirubin: 0.4 mg/dL (ref 0.3–1.2)
Total Protein: 7.2 g/dL (ref 6.5–8.1)

## 2017-10-27 LAB — CBC
HCT: 42.4 % (ref 36.0–46.0)
Hemoglobin: 13.8 g/dL (ref 12.0–15.0)
MCH: 28.9 pg (ref 26.0–34.0)
MCHC: 32.5 g/dL (ref 30.0–36.0)
MCV: 88.9 fL (ref 78.0–100.0)
PLATELETS: 261 10*3/uL (ref 150–400)
RBC: 4.77 MIL/uL (ref 3.87–5.11)
RDW: 12.8 % (ref 11.5–15.5)
WBC: 6.9 10*3/uL (ref 4.0–10.5)

## 2017-10-27 LAB — LIPID PANEL
CHOL/HDL RATIO: 2.8 ratio
CHOLESTEROL: 145 mg/dL (ref 0–200)
HDL: 51 mg/dL (ref >40)
LDL Cholesterol: 71 mg/dL (ref 0–99)
Triglycerides: 115 mg/dL (ref <150)
VLDL: 23 mg/dL (ref 0–40)

## 2017-10-27 LAB — TSH: TSH: 3.594 u[IU]/mL (ref 0.350–4.500)

## 2017-11-03 MED FILL — SYMBICORT 160-4.5 MCG INH: 160-4.5 | 30 days supply | Qty: 10 | Fill #0

## 2017-12-29 MED FILL — PROAIR HFA 90 MCG INHALER: 108 (90 BAS | 75 days supply | Qty: 26 | Fill #1

## 2017-12-29 MED FILL — ROSUVASTATIN CALCIUM 20 MG: 20 | 90 days supply | Qty: 90 | Fill #2

## 2017-12-29 MED FILL — METOPROLOL TARTRATE 25 MG T: 25 | 90 days supply | Qty: 180 | Fill #0

## 2018-02-08 DIAGNOSIS — Z0000 Encounter for general adult medical examination without abnormal findings: Secondary | ICD-10-CM | POA: Diagnosis not present

## 2018-02-08 MED FILL — ESCITALOPRAM 10 MG TABLET: 10 | 90 days supply | Qty: 90 | Fill #0

## 2018-04-07 MED FILL — ROSUVASTATIN CALCIUM 20 MG: 20 | 90 days supply | Qty: 90 | Fill #3

## 2018-04-23 ENCOUNTER — Other Ambulatory Visit: Payer: Self-pay | Admitting: Acute Care

## 2018-04-23 DIAGNOSIS — Z122 Encounter for screening for malignant neoplasm of respiratory organs: Secondary | ICD-10-CM

## 2018-04-23 DIAGNOSIS — F17210 Nicotine dependence, cigarettes, uncomplicated: Secondary | ICD-10-CM

## 2018-05-03 MED FILL — ESCITALOPRAM 10 MG TABLET: 10 | 90 days supply | Qty: 90 | Fill #1

## 2018-05-07 ENCOUNTER — Encounter: Payer: Self-pay | Admitting: Acute Care

## 2018-05-07 ENCOUNTER — Ambulatory Visit (INDEPENDENT_AMBULATORY_CARE_PROVIDER_SITE_OTHER): Payer: 59 | Admitting: Acute Care

## 2018-05-07 ENCOUNTER — Ambulatory Visit (INDEPENDENT_AMBULATORY_CARE_PROVIDER_SITE_OTHER)
Admission: RE | Admit: 2018-05-07 | Discharge: 2018-05-07 | Disposition: A | Discharge status: Home / Self Care | Payer: 59 | Source: Ambulatory Visit | Attending: Acute Care | Admitting: Acute Care

## 2018-05-07 DIAGNOSIS — Z122 Encounter for screening for malignant neoplasm of respiratory organs: Secondary | ICD-10-CM

## 2018-05-07 DIAGNOSIS — F17210 Nicotine dependence, cigarettes, uncomplicated: Secondary | ICD-10-CM | POA: Diagnosis not present

## 2018-05-07 DIAGNOSIS — Z87891 Personal history of nicotine dependence: Secondary | ICD-10-CM | POA: Diagnosis not present

## 2018-05-07 NOTE — Progress Notes (Signed)
Shared Decision Making Visit Lung Cancer Screening Program [phone])   Eligibility:  Age 57 y.o.  Pack Years Smoking History Calculation 34 pack year smoking history (# packs/per year x # years smoked)  Recent History of coughing up blood  no  Unexplained weight loss? no ( >Than 15 pounds within the last 6 months )  Prior History Lung / other cancer no (Diagnosis within the last 5 years already requiring surveillance chest CT Scans).  Smoking Status Current Smoker  Former Smokers: Years since quit: NA  Quit Date:NA  Visit Components:  Discussion included one or more decision making aids. yes  Discussion included risk/benefits of screening. yes  Discussion included potential follow up diagnostic testing for abnormal scans. yes  Discussion included meaning and risk of over diagnosis. yes  Discussion included meaning and risk of False Positives. yes  Discussion included meaning of total radiation exposure. yes  Counseling Included:  Importance of adherence to annual lung cancer LDCT screening. yes  Impact of comorbidities on ability to participate in the program. yes  Ability and willingness to under diagnostic treatment. yes  Smoking Cessation Counseling:  Current Smokers:   Discussed importance of smoking cessation. yes  Information about tobacco cessation classes and interventions provided to patient. yes  Patient provided with "ticket" for LDCT Scan. yes  Symptomatic Patient. no  Counseling  Diagnosis Code: Tobacco Use Z72.0  Asymptomatic Patient yes  Counseling (Intermediate counseling: > three minutes counseling) H4742  Former Smokers:   Discussed the importance of maintaining cigarette abstinence. yes  Diagnosis Code: Personal History of Nicotine Dependence. V95.638  Information about tobacco cessation classes and interventions provided to patient. Yes  Patient provided with "ticket" for LDCT Scan. yes  Written Order for Lung Cancer  Screening with LDCT placed in Epic. Yes (CT Chest Lung Cancer Screening Low Dose W/O CM) [id number] Z12.2-Screening of respiratory organs Z87.891-Personal history of nicotine dependence  I have spent 25 minutes of face to face time with Laura Gillespie discussing the risks and benefits of lung cancer screening. We viewed a power point together that explained in detail the above noted topics. We paused at intervals to allow for questions to be asked and answered to ensure understanding.We discussed that the single most powerful action that she can take to decrease her risk of developing lung cancer is to quit smoking. We discussed whether or not she is ready to commit to setting a quit date. She is not ready to set a quit date.We discussed options for tools to aid in quitting smoking including nicotine replacement therapy, non-nicotine medications, support groups, Quit Smart classes, and behavior modification. We discussed that often times setting smaller, more achievable goals, such as eliminating 1 cigarette a day for a week and then 2 cigarettes a day for a week can be helpful in slowly decreasing the number of cigarettes smoked. This allows for a sense of accomplishment as well as providing a clinical benefit. I gave her the " Be Stronger Than Your Excuses" card with contact information for community resources, classes, free nicotine replacement therapy, and access to mobile apps, text messaging, and on-line smoking cessation help. I have also given her my card and contact information in the event she needs to contact me. We discussed the time and location of the scan, and that either Doroteo Glassman RN or I will call with the results within 24-48 hours of receiving them. I have offered her  a copy of the power point we viewed  as a resource  in the event they need reinforcement of the concepts we discussed today in the office. The patient verbalized understanding of all of  the above and had no further questions  upon leaving the office. They have my contact information in the event they have any further questions.  Pt has successfully quit smoking in the past.  I spent 5 minutes counseling on smoking cessation and the health risks of continued tobacco abuse.  I explained to the patient that there has been a high incidence of coronary artery disease noted on these exams. I explained that this is a non-gated exam therefore degree or severity cannot be determined. This patient is currently on statin therapy. I have asked the patient to follow-up with their PCP regarding any incidental finding of coronary artery disease and management with diet or medication as their PCP  feels is clinically indicated. The patient verbalized understanding of the above and had no further questions upon completion of the visit.      Magdalen Spatz, NP 05/07/2018 3:30 PM

## 2018-05-10 ENCOUNTER — Other Ambulatory Visit: Payer: Self-pay | Admitting: Acute Care

## 2018-05-10 DIAGNOSIS — F17210 Nicotine dependence, cigarettes, uncomplicated: Secondary | ICD-10-CM

## 2018-05-10 DIAGNOSIS — Z122 Encounter for screening for malignant neoplasm of respiratory organs: Secondary | ICD-10-CM

## 2018-05-19 DIAGNOSIS — M9901 Segmental and somatic dysfunction of cervical region: Secondary | ICD-10-CM | POA: Diagnosis not present

## 2018-05-19 DIAGNOSIS — I10 Essential (primary) hypertension: Secondary | ICD-10-CM | POA: Diagnosis not present

## 2018-05-19 DIAGNOSIS — M546 Pain in thoracic spine: Secondary | ICD-10-CM | POA: Diagnosis not present

## 2018-05-19 DIAGNOSIS — M9902 Segmental and somatic dysfunction of thoracic region: Secondary | ICD-10-CM | POA: Diagnosis not present

## 2018-05-19 DIAGNOSIS — M5412 Radiculopathy, cervical region: Secondary | ICD-10-CM | POA: Diagnosis not present

## 2018-05-26 MED FILL — METOPROLOL TARTRATE 25 MG T: 25 | 90 days supply | Qty: 180 | Fill #1

## 2018-08-04 MED FILL — ROSUVASTATIN CALCIUM 20 MG: 20 | 90 days supply | Qty: 90 | Fill #0

## 2018-11-05 MED FILL — ROSUVASTATIN CALCIUM 20 MG: 20 | 90 days supply | Qty: 90 | Fill #1

## 2018-11-05 MED FILL — METOPROLOL TARTRATE 25 MG T: 25 | 90 days supply | Qty: 180 | Fill #2

## 2019-02-10 MED FILL — ROSUVASTATIN CALCIUM 20 MG: 20 | 90 days supply | Qty: 90 | Fill #2

## 2019-02-11 MED FILL — METOPROLOL TARTRATE 25 MG T: 25 | 90 days supply | Qty: 180 | Fill #0

## 2019-04-05 ENCOUNTER — Ambulatory Visit (HOSPITAL_COMMUNITY)
Admission: RE | Admit: 2019-04-05 | Discharge: 2019-04-05 | Disposition: A | Discharge status: Home / Self Care | Payer: No Typology Code available for payment source | Source: Ambulatory Visit | Attending: Pulmonary Disease | Admitting: Pulmonary Disease

## 2019-04-05 ENCOUNTER — Other Ambulatory Visit (HOSPITAL_COMMUNITY): Payer: Self-pay | Admitting: Pulmonary Disease

## 2019-04-05 ENCOUNTER — Other Ambulatory Visit: Payer: Self-pay

## 2019-04-05 DIAGNOSIS — M5440 Lumbago with sciatica, unspecified side: Secondary | ICD-10-CM | POA: Diagnosis not present

## 2019-04-05 MED FILL — predniSONE 10 MG TABS: 10 | 12 days supply | Qty: 30 | Fill #0

## 2019-04-05 MED FILL — ALBUTEROL SULFATE HFA 108 (: 108 (90 BAS | 75 days supply | Qty: 26 | Fill #0

## 2019-04-05 MED FILL — CYCLOBENZAPRINE 10 MG TAB: 10 | 15 days supply | Qty: 30 | Fill #0

## 2019-04-15 ENCOUNTER — Other Ambulatory Visit: Payer: Self-pay | Admitting: Pulmonary Disease

## 2019-04-15 ENCOUNTER — Other Ambulatory Visit (HOSPITAL_COMMUNITY): Payer: Self-pay | Admitting: Pulmonary Disease

## 2019-04-15 DIAGNOSIS — M545 Low back pain, unspecified: Secondary | ICD-10-CM

## 2019-04-21 ENCOUNTER — Other Ambulatory Visit: Payer: Self-pay

## 2019-04-21 ENCOUNTER — Ambulatory Visit (HOSPITAL_COMMUNITY)
Admission: RE | Admit: 2019-04-21 | Discharge: 2019-04-21 | Disposition: A | Discharge status: Home / Self Care | Payer: No Typology Code available for payment source | Source: Ambulatory Visit | Attending: Pulmonary Disease | Admitting: Pulmonary Disease

## 2019-04-21 DIAGNOSIS — M545 Low back pain, unspecified: Secondary | ICD-10-CM

## 2019-04-25 MED FILL — CYCLOBENZAPRINE 10 MG TAB: 10 | 15 days supply | Qty: 30 | Fill #1

## 2019-05-09 ENCOUNTER — Other Ambulatory Visit: Payer: Self-pay

## 2019-05-09 ENCOUNTER — Ambulatory Visit (HOSPITAL_COMMUNITY)
Admission: RE | Admit: 2019-05-09 | Discharge: 2019-05-09 | Disposition: A | Discharge status: Home / Self Care | Payer: No Typology Code available for payment source | Source: Ambulatory Visit | Attending: Acute Care | Admitting: Acute Care

## 2019-05-09 DIAGNOSIS — Z122 Encounter for screening for malignant neoplasm of respiratory organs: Secondary | ICD-10-CM | POA: Insufficient documentation

## 2019-05-09 DIAGNOSIS — F17210 Nicotine dependence, cigarettes, uncomplicated: Secondary | ICD-10-CM | POA: Insufficient documentation

## 2019-05-19 ENCOUNTER — Other Ambulatory Visit: Payer: Self-pay | Admitting: *Deleted

## 2019-05-19 ENCOUNTER — Encounter: Payer: Self-pay | Admitting: *Deleted

## 2019-05-19 ENCOUNTER — Telehealth: Payer: Self-pay | Admitting: Acute Care

## 2019-05-19 DIAGNOSIS — F17210 Nicotine dependence, cigarettes, uncomplicated: Secondary | ICD-10-CM

## 2019-05-19 DIAGNOSIS — Z122 Encounter for screening for malignant neoplasm of respiratory organs: Secondary | ICD-10-CM

## 2019-05-20 NOTE — Telephone Encounter (Signed)
Patient is returning call. [phone]

## 2019-05-20 NOTE — Telephone Encounter (Signed)
Message routed to Denise,RN. 

## 2019-05-23 NOTE — Telephone Encounter (Signed)
Call made to patient, made aware the program is ran by [facility] and her nurse Langley Gauss. Made aware they run that program 2 days a week so I would get this message to them but do not be alarmed if she has not heard back from them within a day or two. Voiced understanding.   Will route message to [place].

## 2019-05-24 NOTE — Telephone Encounter (Signed)
Pt informed of CT results per Sarah Groce, NP.  PT verbalized understanding.  Copy sent to PCP.  Order placed for 1 yr f/u CT.  

## 2019-05-26 MED FILL — ROSUVASTATIN CALCIUM 20 MG: 20 | 90 days supply | Qty: 90 | Fill #3

## 2019-06-27 MED FILL — ALBUTEROL SULFATE HFA 108 (: 108 (90 BAS | 75 days supply | Qty: 26 | Fill #1

## 2019-07-17 NOTE — Progress Notes (Signed)
Cardiology Office Note  Date:  07/18/2019   ID:  Laura Gillespie, DOB Dec 26, 1960, MRN [id number]  PCP:  Sinda Du, MD   Chief Complaint  Patient presents with  . other    C/o sob and shoulder/back pain. Meds reviewed verbally with pt.    HPI:  [name] is a very pleasant 58 year old woman with  long history of smoking,  obesity,  hyperlipidemia,  Coronary artery disease hospital 01/07/2014 with chest pain, angina, non-STEMI. catheterization  showed severe distal RCA disease.  Bare-metal stent  She presents today for new patient evaluation, self-referred for follow-up of her coronary artery disease Last evaluated March 2017 over 3 years ago  Still smoking 1 ppd Reports having worsening wheezing, shortness of breath, worse with exertion especially hills Wants to make sure symptoms are not cardiac related  Home stress Husband with several medical issues, she has to take care of him 57 year old daughter living at their house, extra stress  No regular exercise program Works at [employer]  EKG shows normal sinus rhythm with no significant ST or T-wave changes, heart rate 92 bpm, right bundle branch block , left anterior fascicular block, no change from EKG 2017  Other past medical history reviewed   total cholesterol 142, LDL 46. in 2015  Echocardiogram in the hospital was essentially normal 2015  Cardiac catheterization 2015 showed 99% distal RCA disease, down to 0% residual disease following the stent placement   PMH:   has a past medical history of Acute non-ST segment elevation myocardial infarction [facility]), Coronary artery disease (12/2013), Hyperlipidemia, and Hypertension.  PSH:    Past Surgical History:  Procedure Laterality Date  . CARDIAC CATHETERIZATION    . CARPAL TUNNEL RELEASE     both hand  . CHOLECYSTECTOMY    . CORONARY ANGIOPLASTY  12/2013   stent placement to the distal RCA  . LUNG BIOPSY    . TONSILLECTOMY    . TUBAL LIGATION       Current Outpatient Medications  Medication Sig Dispense Refill  . acetaminophen (TYLENOL) 500 MG tablet Take 1,000 mg by mouth daily as needed for moderate pain.    Marland Kitchen albuterol (PROVENTIL HFA;VENTOLIN HFA) 108 (90 Base) MCG/ACT inhaler Inhale 1-2 puffs into the lungs every 6 (six) hours as needed for wheezing or shortness of breath. 1 Inhaler 0  . aspirin EC 81 MG tablet Take 1 tablet (81 mg total) by mouth daily. 90 tablet 3  . benzonatate (TESSALON) 200 MG capsule Take 1 capsule (200 mg total) by mouth 3 (three) times daily as needed. 30 capsule 0  . CRESTOR 20 MG tablet Take 20 mg by mouth daily.     Marland Kitchen dextromethorphan-guaiFENesin (MUCINEX DM) 30-600 MG 12hr tablet Take 1 tablet by mouth 2 (two) times daily as needed for cough.    Marland Kitchen ibuprofen (ADVIL) 200 MG tablet Take 200 mg by mouth every 6 (six) hours as needed.    . metoprolol tartrate (LOPRESSOR) 25 MG tablet Take 25 mg by mouth 2 (two) times daily.     Marland Kitchen NITROSTAT 0.4 MG SL tablet Place 0.4 mg under the tongue every 5 (five) minutes as needed.     . Pseudoeph-Doxylamine-DM-APAP (NYQUIL PO) Take by mouth as needed.    . Pseudoephedrine HCl (SUDAFED 12 HOUR PO) Take by mouth as needed.    . furosemide (LASIX) 20 MG tablet Take 1 tablet (20 mg total) by mouth daily as needed. 30 tablet 3   No current facility-administered medications for  this visit.      Allergies:   Morphine and related and Percocet [oxycodone-acetaminophen]   Social History:  The patient  reports that she has been smoking cigarettes. She has a 35.00 pack-year smoking history. She has never used smokeless tobacco. She reports that she does not drink alcohol or use drugs.   Family History:   family history includes Heart attack (age of onset: 56) in her father; Heart disease in her father; Hyperlipidemia in her father and mother; Hypertension in her father and mother.    Review of Systems: Review of Systems  Constitutional: Negative.   HENT: Negative.    Respiratory: Positive for shortness of breath.   Cardiovascular: Negative.   Gastrointestinal: Negative.   Musculoskeletal: Negative.   Neurological: Negative.   Psychiatric/Behavioral: Negative.   All other systems reviewed and are negative.   PHYSICAL EXAM: VS:  BP 138/84 (BP Location: Right Arm, Patient Position: Sitting, Cuff Size: Large)   Pulse 92   Ht 5\' 3"  (1.6 m)   Wt 248 lb 4 oz (112.6 kg)   SpO2 97%   BMI 43.98 kg/m  , BMI Body mass index is 43.98 kg/m. GEN: Well nourished, well developed, in no acute distress, obese HEENT: normal Neck: no JVD, carotid bruits, or masses Cardiac: RRR; no murmurs, rubs, or gallops,no edema  Respiratory:  clear to auscultation bilaterally, normal work of breathing GI: soft, nontender, nondistended, + BS MS: no deformity or atrophy Skin: warm and dry, no rash Neuro:  Strength and sensation are intact Psych: euthymic mood, full affect   Recent Labs: No results found for requested labs within last 8760 hours.    Lipid Panel Lab Results  Component Value Date   CHOL 145 10/27/2017   HDL 51 10/27/2017   LDLCALC 71 10/27/2017   TRIG 115 10/27/2017      Wt Readings from Last 3 Encounters:  07/18/19 248 lb 4 oz (112.6 kg)  01/18/17 240 lb (108.9 kg)  11/20/16 260 lb (117.9 kg)     ASSESSMENT AND PLAN:  Problem List Items Addressed This Visit      Cardiology Problems   Hyperlipidemia   Relevant Medications   furosemide (LASIX) 20 MG tablet   Other Relevant Orders   Lipid Profile   Comprehensive Metabolic Panel (CMET)     Other   Smoker   Relevant Orders   EKG 12-Lead    Other Visit Diagnoses    Coronary artery disease of native artery of native heart with stable angina pectoris (HCC)    -  Primary   Relevant Medications   ibuprofen (ADVIL) 200 MG tablet   furosemide (LASIX) 20 MG tablet   Other Relevant Orders   EKG 12-Lead   Lipid Profile   Comprehensive Metabolic Panel (CMET)     Shortness of  breath Likely multifactorial including obesity, deconditioning Unable to exclude fluid retention, pulmonary edema Recommend she try Lasix 20 mg as needed We have ordered a CMP for several weeks with fasting lipids Discussed a low potassium she would need a supplement -Also discussed ischemic work-up including echocardiogram, stress testing, stress echo.  She would like to hold off for now -She is concerned symptoms could be secondary to long as she is wheezing more She will talk with primary care about changing her inhalers  Smoker 1 pack/day 1 pack/day We have encouraged her to continue to work on weaning her cigarettes and smoking cessation. She will continue to work on this and does not want any assistance  with chantix.   Hyperlipidemia Labs have been ordered  Morbid obesity We have encouraged continued exercise, careful diet management in an effort to lose weight.   Disposition:   F/U  12 months   Total encounter time more than 45 minutes  Greater than 50% was spent in counseling and coordination of care with the patient    Signed, Esmond Plants, M.D., Ph.D. [place], [place]

## 2019-07-18 ENCOUNTER — Other Ambulatory Visit: Payer: Self-pay

## 2019-07-18 ENCOUNTER — Encounter: Payer: Self-pay | Admitting: Cardiovascular Disease

## 2019-07-18 ENCOUNTER — Ambulatory Visit: Payer: No Typology Code available for payment source | Admitting: Cardiovascular Disease

## 2019-07-18 VITALS — BP 138/84 | HR 92 | Ht 63.0 in | Wt 248.2 lb

## 2019-07-18 DIAGNOSIS — F17200 Nicotine dependence, unspecified, uncomplicated: Secondary | ICD-10-CM

## 2019-07-18 DIAGNOSIS — E782 Mixed hyperlipidemia: Secondary | ICD-10-CM | POA: Diagnosis not present

## 2019-07-18 DIAGNOSIS — I25118 Atherosclerotic heart disease of native coronary artery with other forms of angina pectoris: Secondary | ICD-10-CM | POA: Diagnosis not present

## 2019-07-18 MED ORDER — FUROSEMIDE 20 MG PO TABS: 20.0000 mg | ORAL_TABLET | Freq: Every day | ORAL | 3 refills | Status: DC | PRN

## 2019-07-18 MED FILL — FUROSEMIDE 20 MG TABS: 20 | 30 days supply | Qty: 30 | Fill #0

## 2019-07-18 NOTE — Patient Instructions (Addendum)
Labs at [facility]: Lipids , CMP   Talk with PMD about better inhalers Lasix as needed for leg swelling, SOB, weight gain  If symptoms get worse, call the office echo and/or stress could be done   Medication Instructions:  No changes  If you need a refill on your cardiac medications before your next appointment, please call your pharmacy.    Lab work: No new labs needed   If you have labs (blood work) drawn today and your tests are completely normal, you will receive your results only by: Marland Kitchen MyChart Message (if you have MyChart) OR . A paper copy in the mail If you have any lab test that is abnormal or we need to change your treatment, we will call you to review the results.   Testing/Procedures: No new testing needed   Follow-Up: At [facility], you and your health needs are our priority.  As part of our continuing mission to provide you with exceptional heart care, we have created designated Provider Care Teams.  These Care Teams include your primary Cardiologist (physician) and Advanced Practice Providers (APPs -  Physician Assistants and Nurse Practitioners) who all work together to provide you with the care you need, when you need it.  . You will need a follow up appointment in 12 months   . Providers on your designated Care Team:   . Murray Hodgkins, NP . Christell Faith, PA-C . Marrianne Mood, PA-C  Any Other Special Instructions Will Be Listed Below (If Applicable).  For educational health videos Log in to : [email or web address] Or : [email or web address], password : [email or web address]

## 2019-08-31 MED FILL — OMRON 3 SERIES BP MONITOR D: 90 days supply | Qty: 1 | Fill #0

## 2019-08-31 MED FILL — ALBUTEROL SULFATE HFA 108 (: 108 (90 BAS | 75 days supply | Qty: 54 | Fill #0

## 2019-08-31 MED FILL — ROSUVASTATIN CALCIUM 20 MG: 20 | 90 days supply | Qty: 90 | Fill #0

## 2019-09-12 MED FILL — METOPROLOL TARTRATE 25 MG T: 25 | 90 days supply | Qty: 180 | Fill #1

## 2019-11-10 ENCOUNTER — Ambulatory Visit: Payer: No Typology Code available for payment source | Admitting: Family Medicine

## 2019-11-23 ENCOUNTER — Other Ambulatory Visit (HOSPITAL_COMMUNITY): Payer: Self-pay | Admitting: Internal Medicine

## 2019-11-23 MED FILL — AMLODIPINE BESYLATE 10 MG T: 10 | 90 days supply | Qty: 90 | Fill #0

## 2019-11-23 MED FILL — METOPROLOL TARTRATE 25 MG T: 25 | 30 days supply | Qty: 60 | Fill #0

## 2019-11-23 MED FILL — ALBUTEROL SULFATE HFA 108 (: 108 (90 BAS | 50 days supply | Qty: 26 | Fill #0

## 2019-11-23 MED FILL — FUROSEMIDE 20 MG TABS: 20 | 90 days supply | Qty: 180 | Fill #0

## 2019-11-23 MED FILL — IPRAT-ALBUT 0.5-3(2.5) MG/3: 0.5-2.5 (3) | 22 days supply | Qty: 270 | Fill #0

## 2019-11-23 MED FILL — ROSUVASTATIN CALCIUM 20 MG: 20 | 90 days supply | Qty: 90 | Fill #0

## 2019-11-24 ENCOUNTER — Other Ambulatory Visit (HOSPITAL_COMMUNITY)
Admission: RE | Admit: 2019-11-24 | Discharge: 2019-11-24 | Disposition: A | Discharge status: Home / Self Care | Payer: No Typology Code available for payment source | Source: Ambulatory Visit | Attending: Internal Medicine | Admitting: Internal Medicine

## 2019-11-24 ENCOUNTER — Encounter: Payer: Self-pay | Admitting: Cardiovascular Disease

## 2019-11-24 DIAGNOSIS — Z1389 Encounter for screening for other disorder: Secondary | ICD-10-CM | POA: Insufficient documentation

## 2019-11-24 DIAGNOSIS — Z6841 Body Mass Index (BMI) 40.0 and over, adult: Secondary | ICD-10-CM | POA: Insufficient documentation

## 2019-11-24 LAB — COMPREHENSIVE METABOLIC PANEL
ALT: 14 U/L (ref 0–44)
AST: 16 U/L (ref 15–41)
Albumin: 4.2 g/dL (ref 3.5–5.0)
Alkaline Phosphatase: 122 U/L (ref 38–126)
Anion gap: 9 (ref 5–15)
BUN: 16 mg/dL (ref 6–20)
CO2: 25 mmol/L (ref 22–32)
Calcium: 9.5 mg/dL (ref 8.9–10.3)
Chloride: 107 mmol/L (ref 98–111)
Creatinine, Ser: 0.69 mg/dL (ref 0.44–1.00)
GFR calc Af Amer: >60 mL/min (ref >60)
GFR calc non Af Amer: >60 mL/min (ref >60)
Glucose, Bld: 112 mg/dL — ABNORMAL HIGH (ref 70–99)
Potassium: 4.5 mmol/L (ref 3.5–5.1)
Sodium: 141 mmol/L (ref 135–145)
Total Bilirubin: 0.6 mg/dL (ref 0.3–1.2)
Total Protein: 7.3 g/dL (ref 6.5–8.1)

## 2019-11-24 LAB — CBC WITH DIFFERENTIAL/PLATELET
Abs Immature Granulocytes: 0.02 10*3/uL (ref 0.00–0.07)
Basophils Absolute: 0 10*3/uL (ref 0.0–0.1)
Basophils Relative: 0 %
Eosinophils Absolute: 0.2 10*3/uL (ref 0.0–0.5)
Eosinophils Relative: 2 %
HCT: 41.3 % (ref 36.0–46.0)
Hemoglobin: 13.5 g/dL (ref 12.0–15.0)
Immature Granulocytes: 0 %
Lymphocytes Relative: 24 %
Lymphs Abs: 2 10*3/uL (ref 0.7–4.0)
MCH: 29.7 pg (ref 26.0–34.0)
MCHC: 32.7 g/dL (ref 30.0–36.0)
MCV: 90.8 fL (ref 80.0–100.0)
Monocytes Absolute: 0.5 10*3/uL (ref 0.1–1.0)
Monocytes Relative: 6 %
Neutro Abs: 5.5 10*3/uL (ref 1.7–7.7)
Neutrophils Relative %: 68 %
Platelets: 253 10*3/uL (ref 150–400)
RBC: 4.55 MIL/uL (ref 3.87–5.11)
RDW: 13 % (ref 11.5–15.5)
WBC: 8.1 10*3/uL (ref 4.0–10.5)
nRBC: 0 % (ref 0.0–0.2)

## 2019-11-24 LAB — LIPID PANEL
Cholesterol: 160 mg/dL (ref 0–200)
HDL: 51 mg/dL (ref >40)
LDL Cholesterol: 80 mg/dL (ref 0–99)
Total CHOL/HDL Ratio: 3.1 RATIO
Triglycerides: 146 mg/dL (ref <150)
VLDL: 29 mg/dL (ref 0–40)

## 2019-11-24 LAB — TSH: TSH: 3.298 u[IU]/mL (ref 0.350–4.500)

## 2019-11-29 ENCOUNTER — Encounter (INDEPENDENT_AMBULATORY_CARE_PROVIDER_SITE_OTHER): Payer: Self-pay | Admitting: *Deleted

## 2020-01-31 ENCOUNTER — Encounter (INDEPENDENT_AMBULATORY_CARE_PROVIDER_SITE_OTHER): Payer: Self-pay | Admitting: *Deleted

## 2020-02-21 MED FILL — AMLODIPINE BESYLATE 10 MG T: 10 | 90 days supply | Qty: 90 | Fill #1

## 2020-03-06 MED FILL — ALBUTEROL SULFATE HFA 108 (: 108 (90 BAS | 50 days supply | Qty: 26 | Fill #2

## 2020-03-06 MED FILL — ROSUVASTATIN CALCIUM 20 MG: 20 | 90 days supply | Qty: 90 | Fill #1

## 2020-03-06 MED FILL — IPRAT-ALBUT 0.5-3(2.5) MG/3: 0.5-2.5 (3) | 22 days supply | Qty: 270 | Fill #1

## 2020-03-16 ENCOUNTER — Other Ambulatory Visit (INDEPENDENT_AMBULATORY_CARE_PROVIDER_SITE_OTHER): Payer: Self-pay | Admitting: *Deleted

## 2020-03-16 DIAGNOSIS — Z1211 Encounter for screening for malignant neoplasm of colon: Secondary | ICD-10-CM

## 2020-03-27 ENCOUNTER — Encounter (INDEPENDENT_AMBULATORY_CARE_PROVIDER_SITE_OTHER): Payer: Self-pay | Admitting: *Deleted

## 2020-03-27 ENCOUNTER — Telehealth (INDEPENDENT_AMBULATORY_CARE_PROVIDER_SITE_OTHER): Payer: Self-pay | Admitting: *Deleted

## 2020-03-27 NOTE — Telephone Encounter (Signed)
Patient needs Sutab (copay card) ° °

## 2020-03-28 MED ORDER — SUTAB 1479-225-188 MG PO TABS: 1.0000 | ORAL_TABLET | Freq: Once | ORAL | 0 refills | Status: AC

## 2020-04-19 MED FILL — IPRAT-ALBUT 0.5-3(2.5) MG/3: 0.5-2.5 (3) | 22 days supply | Qty: 270 | Fill #2

## 2020-04-19 MED FILL — ALBUTEROL SULFATE HFA 108 (: 108 (90 BAS | 50 days supply | Qty: 26 | Fill #3

## 2020-04-23 ENCOUNTER — Telehealth (INDEPENDENT_AMBULATORY_CARE_PROVIDER_SITE_OTHER): Payer: Self-pay | Admitting: *Deleted

## 2020-04-23 NOTE — Telephone Encounter (Signed)
Referring MD/PCP: fusco   Procedure: tcs  Reason/Indication:  screening  Has patient had this procedure before?  no  If so, when, by whom and where?    Is there a family history of colon cancer?  no  Who?  What age when diagnosed?    Is patient diabetic?   no      Does patient have prosthetic heart valve or mechanical valve?  no  Do you have a pacemaker/defibrillator?  no  Has patient ever had endocarditis/atrial fibrillation? no  Does patient use oxygen? no  Has patient had joint replacement within last 12 months?  no  Is patient constipated or do they take laxatives? no  Does patient have a history of alcohol/drug use?  no  Is patient on blood thinner such as Coumadin, Plavix and/or Aspirin? yes  Medications: asa 81 mg daily, amlodipine 10 mg daily, rosuvastatin 20 mg daily  Allergies: nkda  Medication Adjustment per Dr Rehman/Dr Jenetta Downer asa 2 days  Procedure date & time: 05/24/20

## 2020-05-09 ENCOUNTER — Other Ambulatory Visit (INDEPENDENT_AMBULATORY_CARE_PROVIDER_SITE_OTHER): Payer: Self-pay | Admitting: *Deleted

## 2020-05-16 ENCOUNTER — Encounter (INDEPENDENT_AMBULATORY_CARE_PROVIDER_SITE_OTHER): Payer: Self-pay

## 2020-05-22 ENCOUNTER — Other Ambulatory Visit: Payer: Self-pay

## 2020-05-22 ENCOUNTER — Telehealth (INDEPENDENT_AMBULATORY_CARE_PROVIDER_SITE_OTHER): Payer: Self-pay | Admitting: *Deleted

## 2020-05-22 ENCOUNTER — Other Ambulatory Visit (HOSPITAL_COMMUNITY)
Admission: RE | Admit: 2020-05-22 | Discharge: 2020-05-22 | Disposition: A | Discharge status: Home / Self Care | Payer: No Typology Code available for payment source | Source: Ambulatory Visit | Attending: Internal Medicine | Admitting: Internal Medicine

## 2020-05-22 DIAGNOSIS — Z20822 Contact with and (suspected) exposure to covid-19: Secondary | ICD-10-CM | POA: Insufficient documentation

## 2020-05-22 DIAGNOSIS — Z01812 Encounter for preprocedural laboratory examination: Secondary | ICD-10-CM | POA: Insufficient documentation

## 2020-05-23 LAB — SARS CORONAVIRUS 2 (TAT 6-24 HRS): SARS Coronavirus 2: NEGATIVE

## 2020-05-24 ENCOUNTER — Ambulatory Visit (HOSPITAL_COMMUNITY)
Admission: RE | Admit: 2020-05-24 | Discharge: 2020-05-24 | Disposition: A | Discharge status: Home / Self Care | Payer: No Typology Code available for payment source | Attending: Internal Medicine | Admitting: Internal Medicine

## 2020-05-24 ENCOUNTER — Encounter (HOSPITAL_COMMUNITY): Payer: Self-pay | Admitting: Internal Medicine

## 2020-05-24 ENCOUNTER — Encounter (HOSPITAL_COMMUNITY)
Admission: RE | Disposition: A | Discharge status: Home / Self Care | Payer: Self-pay | Source: Home / Self Care | Attending: Internal Medicine

## 2020-05-24 ENCOUNTER — Other Ambulatory Visit: Payer: Self-pay

## 2020-05-24 DIAGNOSIS — Z1211 Encounter for screening for malignant neoplasm of colon: Secondary | ICD-10-CM | POA: Diagnosis not present

## 2020-05-24 DIAGNOSIS — F419 Anxiety disorder, unspecified: Secondary | ICD-10-CM | POA: Insufficient documentation

## 2020-05-24 DIAGNOSIS — Z79899 Other long term (current) drug therapy: Secondary | ICD-10-CM | POA: Diagnosis not present

## 2020-05-24 DIAGNOSIS — Z9049 Acquired absence of other specified parts of digestive tract: Secondary | ICD-10-CM | POA: Diagnosis not present

## 2020-05-24 DIAGNOSIS — K644 Residual hemorrhoidal skin tags: Secondary | ICD-10-CM | POA: Diagnosis not present

## 2020-05-24 DIAGNOSIS — I2510 Atherosclerotic heart disease of native coronary artery without angina pectoris: Secondary | ICD-10-CM | POA: Insufficient documentation

## 2020-05-24 DIAGNOSIS — Z955 Presence of coronary angioplasty implant and graft: Secondary | ICD-10-CM | POA: Diagnosis not present

## 2020-05-24 DIAGNOSIS — K5730 Diverticulosis of large intestine without perforation or abscess without bleeding: Secondary | ICD-10-CM | POA: Diagnosis not present

## 2020-05-24 DIAGNOSIS — Z888 Allergy status to other drugs, medicaments and biological substances status: Secondary | ICD-10-CM | POA: Insufficient documentation

## 2020-05-24 DIAGNOSIS — J449 Chronic obstructive pulmonary disease, unspecified: Secondary | ICD-10-CM | POA: Insufficient documentation

## 2020-05-24 DIAGNOSIS — E785 Hyperlipidemia, unspecified: Secondary | ICD-10-CM | POA: Insufficient documentation

## 2020-05-24 DIAGNOSIS — Z8249 Family history of ischemic heart disease and other diseases of the circulatory system: Secondary | ICD-10-CM | POA: Diagnosis not present

## 2020-05-24 DIAGNOSIS — I10 Essential (primary) hypertension: Secondary | ICD-10-CM | POA: Diagnosis not present

## 2020-05-24 DIAGNOSIS — Z7951 Long term (current) use of inhaled steroids: Secondary | ICD-10-CM | POA: Insufficient documentation

## 2020-05-24 DIAGNOSIS — Z791 Long term (current) use of non-steroidal anti-inflammatories (NSAID): Secondary | ICD-10-CM | POA: Diagnosis not present

## 2020-05-24 DIAGNOSIS — I252 Old myocardial infarction: Secondary | ICD-10-CM | POA: Diagnosis not present

## 2020-05-24 DIAGNOSIS — Z7982 Long term (current) use of aspirin: Secondary | ICD-10-CM | POA: Insufficient documentation

## 2020-05-24 DIAGNOSIS — Z8349 Family history of other endocrine, nutritional and metabolic diseases: Secondary | ICD-10-CM | POA: Diagnosis not present

## 2020-05-24 DIAGNOSIS — G4710 Hypersomnia, unspecified: Secondary | ICD-10-CM | POA: Insufficient documentation

## 2020-05-24 DIAGNOSIS — F17210 Nicotine dependence, cigarettes, uncomplicated: Secondary | ICD-10-CM | POA: Diagnosis not present

## 2020-05-24 DIAGNOSIS — K6289 Other specified diseases of anus and rectum: Secondary | ICD-10-CM | POA: Diagnosis not present

## 2020-05-24 DIAGNOSIS — Z885 Allergy status to narcotic agent status: Secondary | ICD-10-CM | POA: Insufficient documentation

## 2020-05-24 HISTORY — PX: COLONOSCOPY: SHX5424

## 2020-05-24 SURGERY — COLONOSCOPY
Anesthesia: Moderate Sedation

## 2020-05-24 MED ORDER — MEPERIDINE HCL 50 MG/ML IJ SOLN
INTRAMUSCULAR | Status: AC
  Filled 2020-05-24: qty 1

## 2020-05-24 MED ORDER — MEPERIDINE HCL 50 MG/ML IJ SOLN
INTRAMUSCULAR | Status: DC | PRN
  Administered 2020-05-24 (×2): 25 mg

## 2020-05-24 MED ORDER — MIDAZOLAM HCL 5 MG/5ML IJ SOLN
INTRAMUSCULAR | Status: AC
  Filled 2020-05-24: qty 10

## 2020-05-24 MED ORDER — STERILE WATER FOR IRRIGATION IR SOLN
Status: DC | PRN
  Administered 2020-05-24: 100 mL

## 2020-05-24 MED ORDER — MIDAZOLAM HCL 5 MG/5ML IJ SOLN
INTRAMUSCULAR | Status: DC | PRN
  Administered 2020-05-24 (×5): 2 mg via INTRAVENOUS

## 2020-05-24 MED ORDER — SODIUM CHLORIDE 0.9 % IV SOLN: INTRAVENOUS | Status: DC

## 2020-05-24 NOTE — Discharge Instructions (Signed)
Resume usual medications including aspirin as before. Resume usual diet. No driving for 24 hours. Next screening exam in 10 years.      Colonoscopy, Adult, Care After This sheet gives you information about how to care for yourself after your procedure. Your doctor may also give you more specific instructions. If you have problems or questions, call your doctor. What can I expect after the procedure? After the procedure, it is common to have:  A small amount of blood in your poop (stool) for 24 hours.  Some gas.  Mild cramping or bloating in your belly (abdomen). Follow these instructions at home: Eating and drinking   Drink enough fluid to keep your pee (urine) pale yellow.  Follow instructions from your doctor about what you cannot eat or drink.  Return to your normal diet as told by your doctor. Avoid heavy or fried foods that are hard to digest. Activity  Rest as told by your doctor.  Do not sit for a long time without moving. Get up to take short walks every 1-2 hours. This is important. Ask for help if you feel weak or unsteady.  Return to your normal activities as told by your doctor. Ask your doctor what activities are safe for you. To help cramping and bloating:   Try walking around.  Put heat on your belly as told by your doctor. Use the heat source that your doctor recommends, such as a moist heat pack or a heating pad. ? Put a towel between your skin and the heat source. ? Leave the heat on for 20-30 minutes. ? Remove the heat if your skin turns bright red. This is very important if you are unable to feel pain, heat, or cold. You may have a greater risk of getting burned. General instructions  For the first 24 hours after the procedure: ? Do not drive or use machinery. ? Do not sign important documents. ? Do not drink alcohol. ? Do your daily activities more slowly than normal. ? Eat foods that are soft and easy to digest.  Take over-the-counter or  prescription medicines only as told by your doctor.  Keep all follow-up visits as told by your doctor. This is important. Contact a doctor if:  You have blood in your poop 2-3 days after the procedure. Get help right away if:  You have more than a small amount of blood in your poop.  You see large clumps of tissue (blood clots) in your poop.  Your belly is swollen.  You feel like you may vomit (nauseous).  You vomit.  You have a fever.  You have belly pain that gets worse, and medicine does not help your pain. Summary  After the procedure, it is common to have a small amount of blood in your poop. You may also have mild cramping and bloating in your belly.  For the first 24 hours after the procedure, do not drive or use machinery, do not sign important documents, and do not drink alcohol.  Get help right away if you have a lot of blood in your poop, feel like you may vomit, have a fever, or have more belly pain. This information is not intended to replace advice given to you by your health care provider. Make sure you discuss any questions you have with your health care provider. Document Revised: 03/07/2019 Document Reviewed: 03/07/2019 Elsevier Patient Education  [employer].     Diverticulosis  Diverticulosis is a condition that develops when small  pouches (diverticula) form in the wall of the large intestine (colon). The colon is where water is absorbed and stool (feces) is formed. The pouches form when the inside layer of the colon pushes through weak spots in the outer layers of the colon. You may have a few pouches or many of them. The pouches usually do not cause problems unless they become inflamed or infected. When this happens, the condition is called diverticulitis. What are the causes? The cause of this condition is not known. What increases the risk? The following factors may make you more likely to develop this condition:  Being older than age 47.  Your risk for this condition increases with age. Diverticulosis is rare among people younger than age 37. By age 64, many people have it.  Eating a low-fiber diet.  Having frequent constipation.  Being overweight.  Not getting enough exercise.  Smoking.  Taking over-the-counter pain medicines, like aspirin and ibuprofen.  Having a family history of diverticulosis. What are the signs or symptoms? In most people, there are no symptoms of this condition. If you do have symptoms, they may include:  Bloating.  Cramps in the abdomen.  Constipation or diarrhea.  Pain in the lower left side of the abdomen. How is this diagnosed? Because diverticulosis usually has no symptoms, it is most often diagnosed during an exam for other colon problems. The condition may be diagnosed by:  Using a flexible scope to examine the colon (colonoscopy).  Taking an X-ray of the colon after dye has been put into the colon (barium enema).  Having a CT scan. How is this treated? You may not need treatment for this condition. Your health care provider may recommend treatment to prevent problems. You may need treatment if you have symptoms or if you previously had diverticulitis. Treatment may include:  Eating a high-fiber diet.  Taking a fiber supplement.  Taking a live bacteria supplement (probiotic).  Taking medicine to relax your colon. Follow these instructions at home: Medicines  Take over-the-counter and prescription medicines only as told by your health care provider.  If told by your health care provider, take a fiber supplement or probiotic. Constipation prevention Your condition may cause constipation. To prevent or treat constipation, you may need to:  Drink enough fluid to keep your urine pale yellow.  Take over-the-counter or prescription medicines.  Eat foods that are high in fiber, such as beans, whole grains, and fresh fruits and vegetables.  Limit foods that are high in  fat and processed sugars, such as fried or sweet foods.  General instructions  Try not to strain when you have a bowel movement.  Keep all follow-up visits as told by your health care provider. This is important. Contact a health care provider if you:  Have pain in your abdomen.  Have bloating.  Have cramps.  Have not had a bowel movement in 3 days. Get help right away if:  Your pain gets worse.  Your bloating becomes very bad.  You have a fever or chills, and your symptoms suddenly get worse.  You vomit.  You have bowel movements that are bloody or black.  You have bleeding from your rectum. Summary  Diverticulosis is a condition that develops when small pouches (diverticula) form in the wall of the large intestine (colon).  You may have a few pouches or many of them.  This condition is most often diagnosed during an exam for other colon problems.  Treatment may include increasing the fiber in  your diet, taking supplements, or taking medicines. This information is not intended to replace advice given to you by your health care provider. Make sure you discuss any questions you have with your health care provider. Document Revised: 03/10/2019 Document Reviewed: 03/10/2019 Elsevier Patient Education  [employer].

## 2020-05-24 NOTE — H&P (Signed)
Laura Gillespie is an 59 y.o. female.   Chief Complaint: Patient is here for colonoscopy. HPI: Patient is 59 year old Caucasian female who is here for screening colonoscopy.  This is patient's first exam.  She denies abdominal pain change in bowel habits or rectal bleeding. Family history is negative for CRC. Aspirin is on hold.  Past Medical History:  Diagnosis Date   Acute non-ST segment elevation myocardial infarction [facility])    Allergic rhinitis due to pollen    Anxiety disorder, unspecified    Atherosclerotic heart disease of native coronary artery without angina pectoris    Bronchitis, not specified as acute or chronic    Chronic obstructive pulmonary disease with (acute) exacerbation (HCC)    Coronary artery disease 12/2013   s/p bare metal stent to distal RCA   Essential (primary) hypertension    Hyperlipidemia    Hyperlipidemia, unspecified    Hypertension    Nicotine dependence, unspecified, uncomplicated    Other hypersomnia     Past Surgical History:  Procedure Laterality Date   CARDIAC CATHETERIZATION     CARPAL TUNNEL RELEASE     both hand   CHOLECYSTECTOMY     CORONARY ANGIOPLASTY  12/2013   stent placement to the distal RCA   LOBECTOMY Right 1991   LUNG BIOPSY     TONSILLECTOMY     TUBAL LIGATION      Family History  Problem Relation Age of Onset   Hypertension Mother    Hyperlipidemia Mother    Hypertension Father    Hyperlipidemia Father    Heart disease Father    Heart attack Father 75   Social History:  reports that she has been smoking cigarettes. She has a 35.00 pack-year smoking history. She has never used smokeless tobacco. She reports that she does not drink alcohol and does not use drugs.  Allergies:  Allergies  Allergen Reactions   Morphine And Related Other (See Comments)    Ineffective   Percocet [Oxycodone-Acetaminophen] Itching and Rash    Medications Prior to Admission  Medication Sig Dispense Refill    acetaminophen (TYLENOL) 500 MG tablet Take 1,000 mg by mouth every 6 (six) hours as needed for moderate pain.      albuterol (PROVENTIL HFA;VENTOLIN HFA) 108 (90 Base) MCG/ACT inhaler Inhale 1-2 puffs into the lungs every 6 (six) hours as needed for wheezing or shortness of breath. 1 Inhaler 0   amLODipine (NORVASC) 10 MG tablet Take 10 mg by mouth daily.     aspirin EC 81 MG tablet Take 1 tablet (81 mg total) by mouth daily. 90 tablet 3   CRESTOR 20 MG tablet Take 20 mg by mouth daily.      ibuprofen (ADVIL) 200 MG tablet Take 400 mg by mouth every 6 (six) hours as needed for moderate pain.      ALPRAZolam (XANAX) 0.5 MG tablet Take 0.5 mg by mouth 2 (two) times daily as needed for anxiety. (Patient not taking: Reported on 05/15/2020)     benzonatate (TESSALON) 200 MG capsule Take 1 capsule (200 mg total) by mouth 3 (three) times daily as needed. (Patient not taking: Reported on 05/15/2020) 30 capsule 0   budesonide-formoterol (SYMBICORT) 160-4.5 MCG/ACT inhaler Inhale 2 puffs into the lungs 2 (two) times daily. (Patient not taking: Reported on 05/15/2020)     cyclobenzaprine (FLEXERIL) 10 MG tablet Take 10 mg by mouth 2 (two) times daily as needed for muscle spasms. (Patient not taking: Reported on 05/15/2020)     dextromethorphan-guaiFENesin (  MUCINEX DM) 30-600 MG 12hr tablet Take 1 tablet by mouth 2 (two) times daily as needed for cough. (Patient not taking: Reported on 05/15/2020)     escitalopram (LEXAPRO) 10 MG tablet Take 10 mg by mouth daily. (Patient not taking: Reported on 05/15/2020)     furosemide (LASIX) 20 MG tablet Take 1 tablet (20 mg total) by mouth daily as needed. 30 tablet 3   losartan (COZAAR) 25 MG tablet Take 25 mg by mouth daily. (Patient not taking: Reported on 05/15/2020)     metoprolol tartrate (LOPRESSOR) 25 MG tablet Take 25 mg by mouth 2 (two) times daily.  (Patient not taking: Reported on 05/15/2020)     NITROSTAT 0.4 MG SL tablet Place 0.4 mg under the  tongue every 5 (five) minutes as needed.  (Patient not taking: Reported on 05/15/2020)     Pseudoeph-Doxylamine-DM-APAP (NYQUIL PO) Take by mouth as needed. (Patient not taking: Reported on 05/15/2020)     Pseudoephedrine HCl (SUDAFED 12 HOUR PO) Take by mouth as needed. (Patient not taking: Reported on 05/15/2020)      No results found for this or any previous visit (from the past 48 hour(s)). No results found.  Review of Systems  Blood pressure 138/73, pulse 85, temperature 98 F (36.7 C), temperature source Oral, resp. rate (!) 25, height 5\' 3"  (1.6 m), weight 104.3 kg, SpO2 99 %. Physical Exam HENT:     Mouth/Throat:     Mouth: Mucous membranes are moist.     Pharynx: Oropharynx is clear.  Eyes:     General: No scleral icterus.    Conjunctiva/sclera: Conjunctivae normal.  Cardiovascular:     Rate and Rhythm: Normal rate and regular rhythm.     Heart sounds: Normal heart sounds. No murmur heard.   Pulmonary:     Effort: Pulmonary effort is normal.     Breath sounds: Normal breath sounds.  Abdominal:     Comments: Abdomen is full.  Soft and nontender with organomegaly or masses.  Musculoskeletal:        General: No swelling.     Cervical back: Neck supple.  Lymphadenopathy:     Cervical: No cervical adenopathy.  Skin:    General: Skin is warm and dry.  Neurological:     Mental Status: She is alert.      Assessment/Plan  Average risk screening colonoscopy.  Hildred Laser, MD 05/24/2020, 9:57 AM

## 2020-05-24 NOTE — Op Note (Signed)
[facility] Patient Name: Laura Gillespie Procedure Date: 05/24/2020 9:40 AM MRN: [id number] Date of Birth: Aug 27, 1960 Attending MD: Hildred Laser , MD CSN: [id number] Age: 59 Admit Type: Outpatient Procedure:                Colonoscopy Indications:              Screening for colorectal malignant neoplasm Providers:                Hildred Laser, MD, Lambert Mody, Janeece Riggers,                            RN, Aram Candela, Kristine L. Risa Grill, Technician Referring MD:             Redmond [employer], MD Medicines:                Meperidine 50 mg IV, Midazolam 10 mg IV Complications:            No immediate complications. Estimated Blood Loss:     Estimated blood loss: none. Procedure:                Pre-Anesthesia Assessment:                           - Prior to the procedure, a History and Physical                            was performed, and patient medications and                            allergies were reviewed. The patient's tolerance of                            previous anesthesia was also reviewed. The risks                            and benefits of the procedure and the sedation                            options and risks were discussed with the patient.                            All questions were answered, and informed consent                            was obtained. Prior Anticoagulants: The patient has                            taken no previous anticoagulant or antiplatelet                            agents except for aspirin. ASA Grade Assessment:                            III - A patient with severe systemic disease. After  reviewing the risks and benefits, the patient was                            deemed in satisfactory condition to undergo the                            procedure.                           After obtaining informed consent, the colonoscope                            was passed under direct vision. Throughout  the                            procedure, the patient's blood pressure, pulse, and                            oxygen saturations were monitored continuously. The                            PCF-[id number] ([id number]) scope was introduced through                            the anus and advanced to the the cecum, identified                            by appendiceal orifice and ileocecal valve. The                            colonoscopy was performed without difficulty. The                            patient tolerated the procedure well. The quality                            of the bowel preparation was adequate. The                            ileocecal valve, appendiceal orifice, and rectum                            were photographed. Scope In: 10:08:23 AM Scope Out: 10:21:42 AM Scope Withdrawal Time: 0 hours 8 minutes 1 second  Total Procedure Duration: 0 hours 13 minutes 19 seconds  Findings:      The perianal and digital rectal examinations were normal.      A single small-mouthed diverticulum was found in the hepatic flexure.      The exam was otherwise normal throughout the examined colon.      External hemorrhoids were found during retroflexion. The hemorrhoids       were small.      Anal papilla(e) were hypertrophied. Impression:               - Diverticulosis at the hepatic flexure.                           -  External hemorrhoids.                           - Anal papilla(e) were hypertrophied.                           - No specimens collected. Moderate Sedation:      Moderate (conscious) sedation was administered by the endoscopy nurse       and supervised by the endoscopist. The following parameters were       monitored: oxygen saturation, heart rate, blood pressure, CO2       capnography and response to care. Total physician intraservice time was       21 minutes. Recommendation:           - Patient has a contact number available for                            emergencies. The  signs and symptoms of potential                            delayed complications were discussed with the                            patient. Return to normal activities tomorrow.                            Written discharge instructions were provided to the                            patient.                           - Resume previous diet today.                           - Continue present medications.                           - Repeat colonoscopy in 10 years for screening                            purposes. Procedure Code(s):        --- Professional ---                           [phone], Colonoscopy, flexible; diagnostic, including                            collection of specimen(s) by brushing or washing,                            when performed (separate procedure)                           G0500, Moderate sedation services provided by the  same physician or other qualified health care                            professional performing a gastrointestinal                            endoscopic service that sedation supports,                            requiring the presence of an independent trained                            observer to assist in the monitoring of the                            patient's level of consciousness and physiological                            status; initial 15 minutes of intra-service time;                            patient age 45 years or older (additional time may                            be reported with [phone], as appropriate) Diagnosis Code(s):        --- Professional ---                           Z12.11, Encounter for screening for malignant                            neoplasm of colon                           K64.4, Residual hemorrhoidal skin tags                           K62.89, Other specified diseases of anus and rectum                           K57.30, Diverticulosis of large intestine without                             perforation or abscess without bleeding CPT copyright 2019 [employer]. All rights reserved. The codes documented in this report are preliminary and upon coder review may  be revised to meet current compliance requirements. Hildred Laser, MD Hildred Laser, MD 05/24/2020 10:29:13 AM This report has been signed electronically. Number of Addenda: 0

## 2020-05-30 ENCOUNTER — Encounter (HOSPITAL_COMMUNITY): Payer: Self-pay | Admitting: Internal Medicine

## 2020-06-13 ENCOUNTER — Other Ambulatory Visit (HOSPITAL_COMMUNITY): Payer: Self-pay | Admitting: Internal Medicine

## 2020-06-13 MED FILL — IPRAT-ALBUT 0.5-3(2.5) MG/3: 0.5-2.5 (3) | 22 days supply | Qty: 270 | Fill #3

## 2020-06-13 MED FILL — ALBUTEROL SULFATE HFA 108 (: 108 (90 BAS | 51 days supply | Qty: 26 | Fill #0

## 2020-06-14 MED FILL — AMLODIPINE BESYLATE 10 MG T: 10 | 90 days supply | Qty: 90 | Fill #2

## 2020-08-28 IMAGING — CT CT CHEST LUNG CANCER SCREENING LOW DOSE W/O CM
2 of 4 series · 15 of 40 positions shown, 18 images · non-contrast
Comparison: None.

CLINICAL DATA: 57-year-old female with 34 pack-year history of
smoking. Lung cancer screening.

EXAM:
CT CHEST WITHOUT CONTRAST LOW-DOSE FOR LUNG CANCER SCREENING
TECHNIQUE: Multidetector CT imaging of the chest was performed following the
standard protocol without IV contrast.

[Series 2: thorax 5.0 i31f 3 · axial · 0.76mm/px · z∈[-316,-56]mm · 12 of 62 slices shown, 15 images]
[im 5/62  mediastinal]
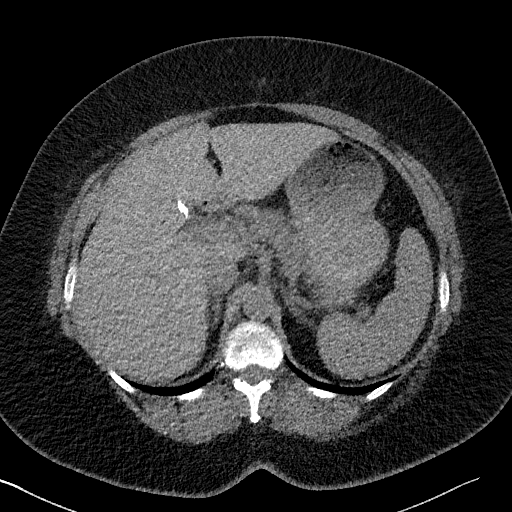
[im 5/62  lung]
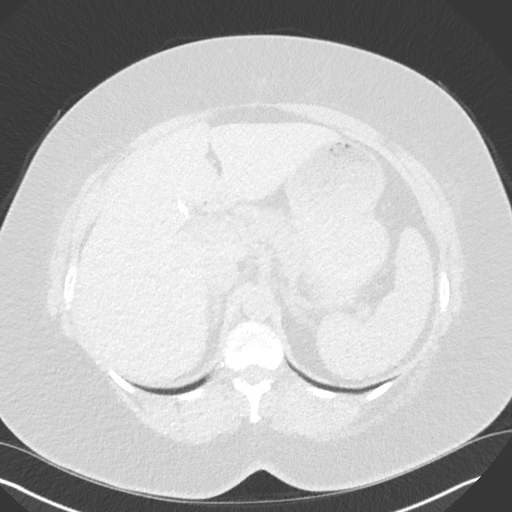
[im 10/62  lung]
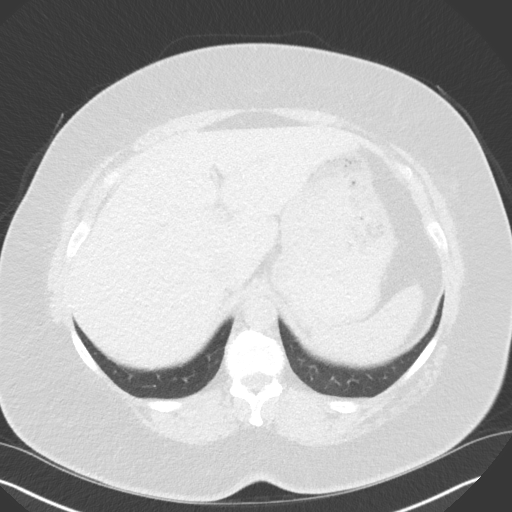
[im 15/62  lung]
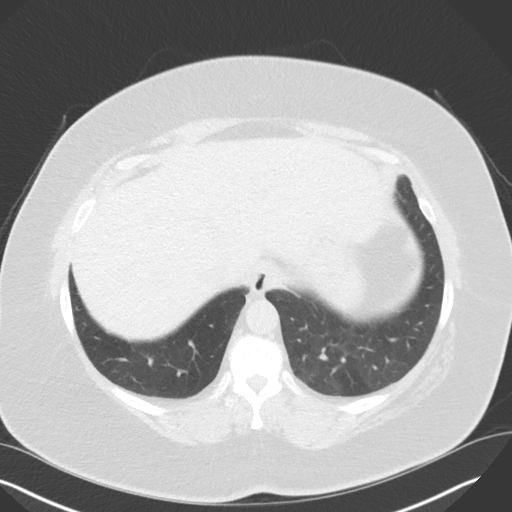
[im 19/62  lung]
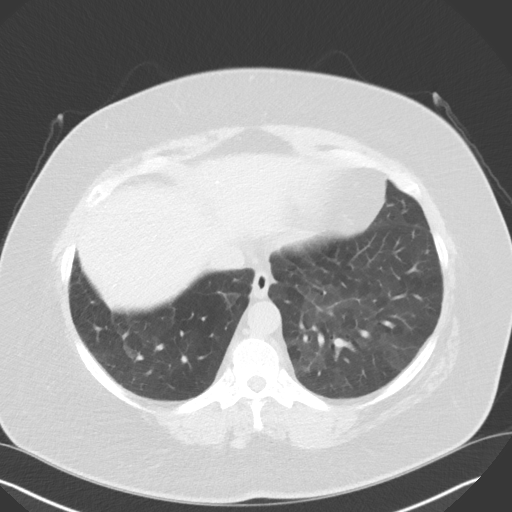
[im 24/62  mediastinal]
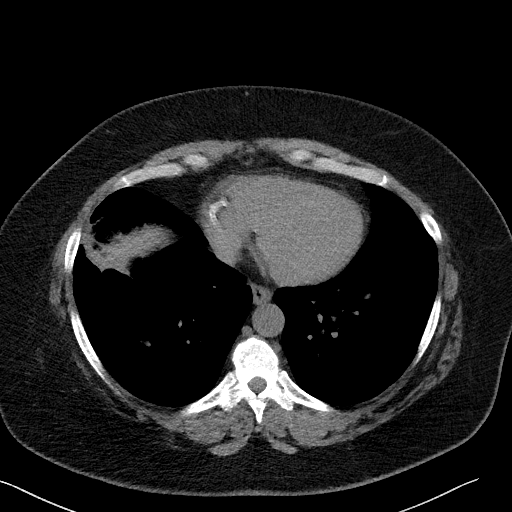
[im 24/62  lung]
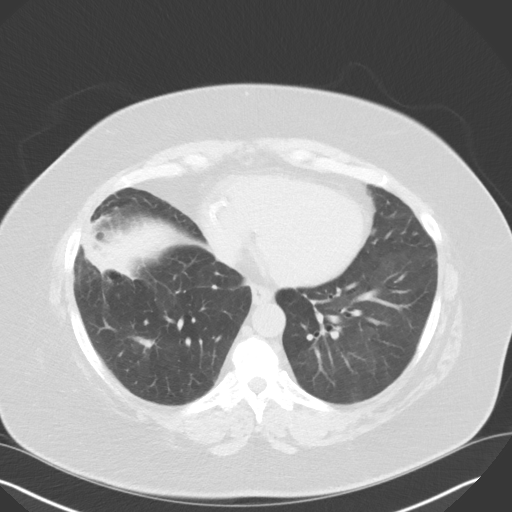
[im 29/62  lung]
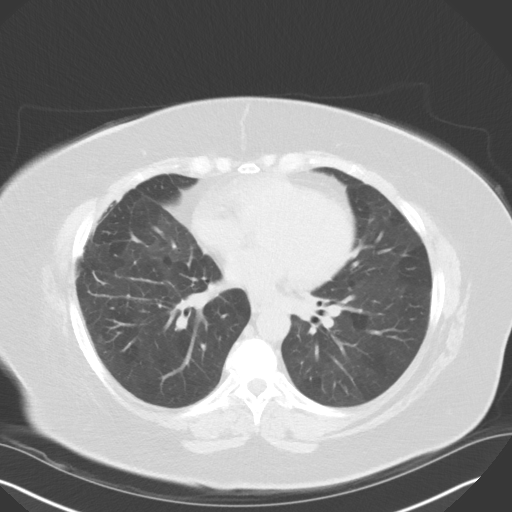
[im 33/62  lung]
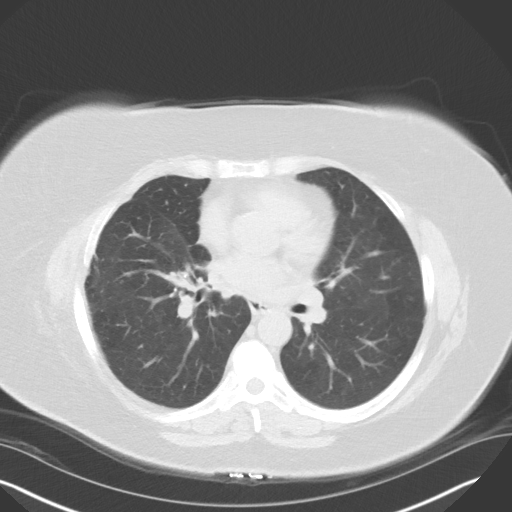
[im 38/62  lung]
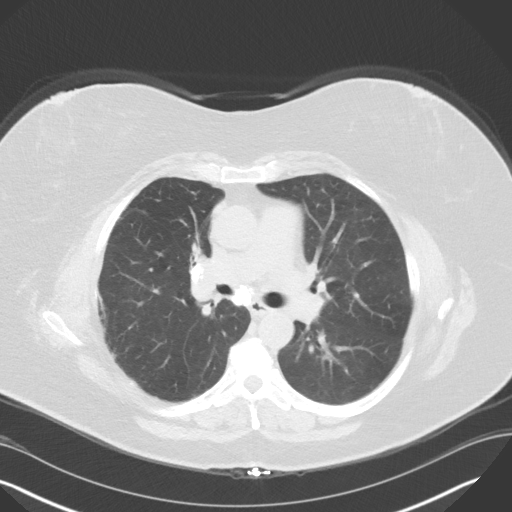
[im 43/62  mediastinal]
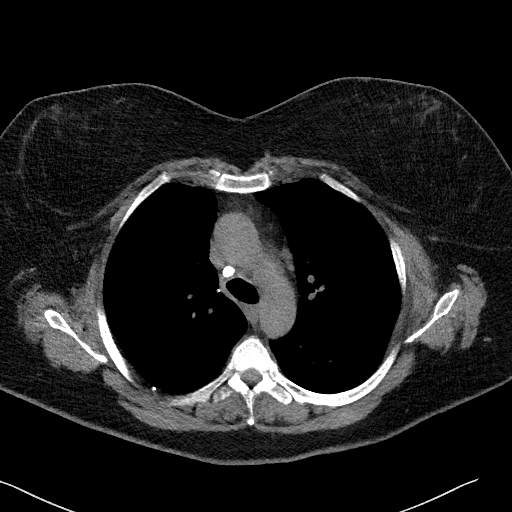
[im 43/62  lung]
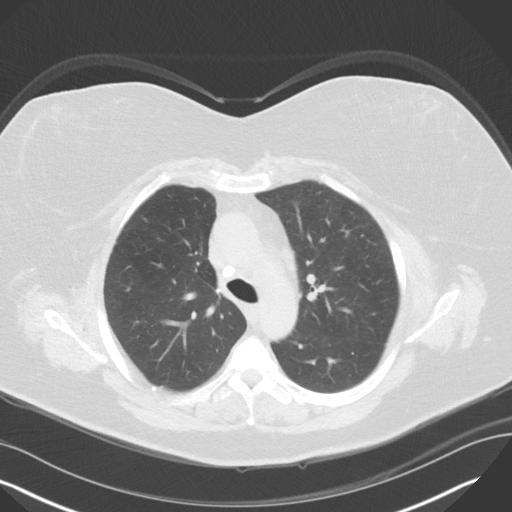
[im 47/62  lung]
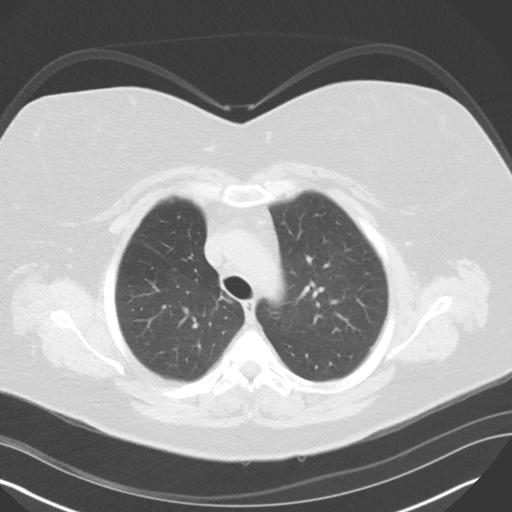
[im 52/62  lung]
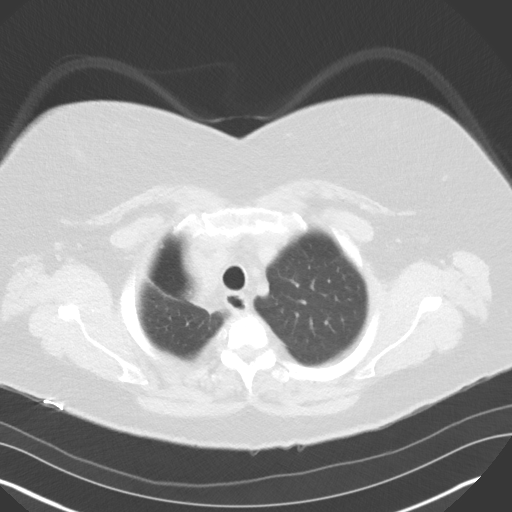
[im 57/62  lung]
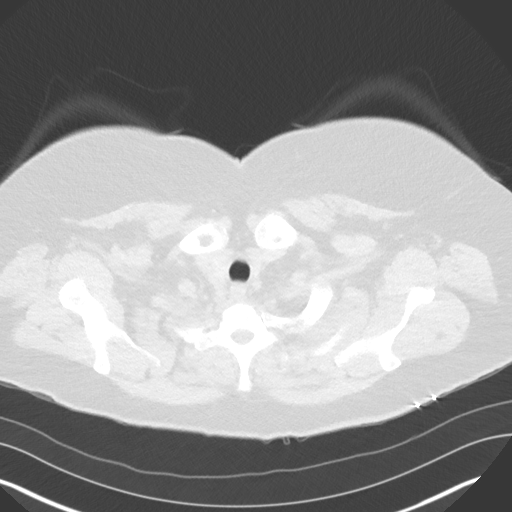

[Series 5: coronal · coronal · 0.60mm/px · 3 of 130 slices shown]
[im 26/130  lung]
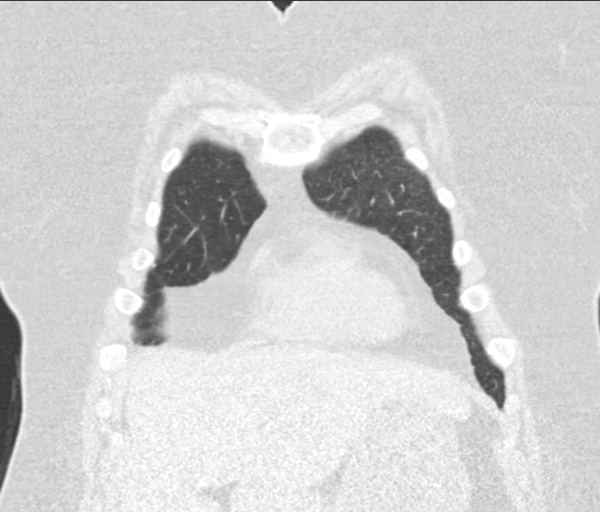
[im 52/130  lung]
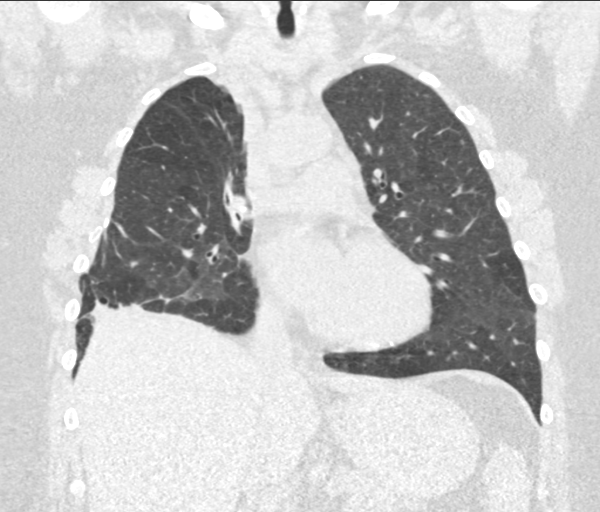
[im 78/130  lung]
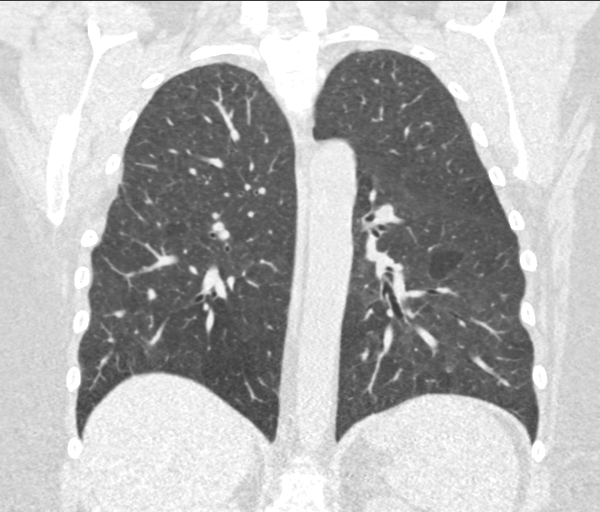

[15 of 40 positions shown; findings below may reference images not displayed]

FINDINGS: Cardiovascular: The heart size is normal. No substantial pericardial
effusion. Coronary artery calcification is evident. No thoracic
aortic aneurysm.

Mediastinum/Nodes: Calcified nodal tissue is identified in the
paratracheal space and right hilum. No axillary lymphadenopathy. The
esophagus has normal imaging features.

Lungs/Pleura: The central tracheobronchial airways are patent.
Centrilobular emphysema noted. Multiple bilateral pulmonary nodules
are evident. Some of these are calcified consistent with
granulomata. The largest noncalcified nodule has volume derived
equivalent diameter of 5.4 mm. No focal airspace consolidation. No
pulmonary edema or pleural effusion.

Upper Abdomen: Calcified granulomata in the spleen.

Musculoskeletal: No worrisome lytic or sclerotic osseous
abnormality.
IMPRESSION: 1. Lung-RADS 2, benign appearance or behavior. Continue annual
screening with low-dose chest CT without contrast in 12 months.
2.  Aortic Atherosclerois (PY9EA-170.0)
3.  Emphysema. (PY9EA-IHD.7)

## 2020-09-03 ENCOUNTER — Other Ambulatory Visit (HOSPITAL_COMMUNITY): Payer: Self-pay | Admitting: Internal Medicine

## 2020-09-03 MED FILL — ROSUVASTATIN CALCIUM 20 MG: 20 | 90 days supply | Qty: 90 | Fill #2

## 2020-09-03 MED FILL — IPRAT-ALBUT 0.5-3(2.5) MG/3: 0.5-2.5 (3) | 22 days supply | Qty: 270 | Fill #0

## 2020-09-03 MED FILL — ALBUTEROL SULFATE HFA 108 (: 108 (90 BAS | 51 days supply | Qty: 26 | Fill #1

## 2020-09-26 ENCOUNTER — Other Ambulatory Visit (HOSPITAL_COMMUNITY): Payer: Self-pay | Admitting: Dermatology

## 2020-09-26 DIAGNOSIS — L308 Other specified dermatitis: Secondary | ICD-10-CM | POA: Diagnosis not present

## 2020-09-26 DIAGNOSIS — L820 Inflamed seborrheic keratosis: Secondary | ICD-10-CM | POA: Diagnosis not present

## 2020-09-26 DIAGNOSIS — D225 Melanocytic nevi of trunk: Secondary | ICD-10-CM | POA: Diagnosis not present

## 2020-09-26 DIAGNOSIS — D485 Neoplasm of uncertain behavior of skin: Secondary | ICD-10-CM | POA: Diagnosis not present

## 2020-09-26 DIAGNOSIS — L918 Other hypertrophic disorders of the skin: Secondary | ICD-10-CM | POA: Diagnosis not present

## 2020-09-27 MED FILL — BETAMETHASONE DP AUG 0.05%: 0.05 | 30 days supply | Qty: 50 | Fill #0

## 2020-10-13 MED FILL — AMLODIPINE BESYLATE 10 MG T: 10 | 90 days supply | Qty: 90 | Fill #3

## 2020-10-13 MED FILL — ALBUTEROL SULFATE HFA 108 (: 108 (90 BAS | 51 days supply | Qty: 26 | Fill #2

## 2020-11-05 NOTE — Progress Notes (Signed)
Cardiology Office Note  Date:  11/06/2020   ID:  Laura Gillespie, DOB [birth year], MRN [id number]  PCP:  Redmond School, MD   Chief Complaint  Patient presents with  . Follow-up    Annual follow up and medications verbally reviewed with patient.     HPI:  Laura Gillespie is a very pleasant 60 year old woman with  long history of smoking,  obesity,  hyperlipidemia,  Coronary artery disease hospital 01/07/2014 with chest pain, angina, non-STEMI. catheterization  showed severe distal RCA disease.  Bare-metal stent  She presents  forfollow-up of her coronary artery disease  Last evaluated 06/2019  Still smoking 1 ppd Denies any chest pain concerning for angina No regular exercise program Works at [employer] On her feet much of the day, mild leg swelling  Denies having diabetes  Husband died end of 11/23/2019 Very stressful time Past from diabetes complications On hospice  Has teenage daughter Records requested from primary care, [employer] Lab work reviewed Normal LFTs, normal TSH  EKG personally reviewed by myself on todays visit Normal sinus rhythm rate 80 bpm right bundle branch block, left anterior fascicular block, no significant change  Other past medical history reviewed Echocardiogram in the hospital was essentially normal November 22, 2013  Cardiac catheterization 22-Nov-2013 showed 99% distal RCA disease, down to 0% residual disease following the stent placement   PMH:   has a past medical history of Acute non-ST segment elevation myocardial infarction [facility]), Allergic rhinitis due to pollen, Anxiety disorder, unspecified, Atherosclerotic heart disease of native coronary artery without angina pectoris, Bronchitis, not specified as acute or chronic, Chronic obstructive pulmonary disease with (acute) exacerbation ([place]), Coronary artery disease (12/2013), Essential (primary) hypertension, Hyperlipidemia, Hyperlipidemia, unspecified, Hypertension, Nicotine dependence,  unspecified, uncomplicated, and Other hypersomnia.  PSH:    Past Surgical History:  Procedure Laterality Date  . CARDIAC CATHETERIZATION    . CARPAL TUNNEL RELEASE     both hand  . CHOLECYSTECTOMY    . COLONOSCOPY N/A 05/24/2020   Procedure: COLONOSCOPY;  Surgeon: Rogene Houston, MD;  [facility];  Service: Endoscopy;  Laterality: N/A;  1030  . CORONARY ANGIOPLASTY  12/2013   stent placement to the distal RCA  . LOBECTOMY Right 1991  . LUNG BIOPSY    . TONSILLECTOMY    . TUBAL LIGATION      Current Outpatient Medications  Medication Sig Dispense Refill  . acetaminophen (TYLENOL) 500 MG tablet Take 1,000 mg by mouth every 6 (six) hours as needed for moderate pain.     Marland Kitchen albuterol (PROVENTIL HFA;VENTOLIN HFA) 108 (90 Base) MCG/ACT inhaler Inhale 1-2 puffs into the lungs every 6 (six) hours as needed for wheezing or shortness of breath. 1 Inhaler 0  . amLODipine (NORVASC) 10 MG tablet Take 10 mg by mouth daily.    Marland Kitchen aspirin EC 81 MG tablet Take 1 tablet (81 mg total) by mouth daily. 90 tablet 3  . CRESTOR 20 MG tablet Take 20 mg by mouth daily.     . furosemide (LASIX) 20 MG tablet Take 1 tablet (20 mg total) by mouth daily as needed. 30 tablet 3  . ibuprofen (ADVIL) 200 MG tablet Take 400 mg by mouth every 6 (six) hours as needed for moderate pain.      No current facility-administered medications for this visit.     Allergies:   Morphine and related and Percocet [oxycodone-acetaminophen]   Social History:  The patient  reports that she has been smoking cigarettes. She has  a 35.00 pack-year smoking history. She has never used smokeless tobacco. She reports that she does not drink alcohol and does not use drugs.   Family History:   family history includes Heart attack (age of onset: 28) in her father; Heart disease in her father; Hyperlipidemia in her father and mother; Hypertension in her father and mother.    Review of Systems: Review of Systems  Constitutional:  Negative.   HENT: Negative.   Respiratory: Positive for shortness of breath.   Cardiovascular: Negative.   Gastrointestinal: Negative.   Musculoskeletal: Negative.   Neurological: Negative.   Psychiatric/Behavioral: Negative.   All other systems reviewed and are negative.   PHYSICAL EXAM: VS:  BP (!) 150/80 (BP Location: Left Arm, Patient Position: Sitting, Cuff Size: Normal)   Pulse 80   Ht 5\' 3"  (1.6 m)   Wt 223 lb (101.2 kg)   SpO2 94%   BMI 39.50 kg/m  , BMI Body mass index is 39.5 kg/m. Constitutional:  oriented to person, place, and time. No distress.  HENT:  Head: Grossly normal Eyes:  no discharge. No scleral icterus.  Neck: No JVD, no carotid bruits  Cardiovascular: Regular rate and rhythm, no murmurs appreciated Pulmonary/Chest: Clear to auscultation bilaterally, no wheezes or rails Abdominal: Soft.  no distension.  no tenderness.  Musculoskeletal: Normal range of motion Neurological:  normal muscle tone. Coordination normal. No atrophy Skin: Skin warm and dry Psychiatric: normal affect, pleasant  Recent Labs: 11/24/2019: ALT 14; BUN 16; Creatinine, Ser 0.69; Hemoglobin 13.5; Platelets 253; Potassium 4.5; Sodium 141; TSH 3.298    Lipid Panel Lab Results  Component Value Date   CHOL 160 11/24/2019   HDL 51 11/24/2019   LDLCALC 80 11/24/2019   TRIG 146 11/24/2019      Wt Readings from Last 3 Encounters:  11/06/20 223 lb (101.2 kg)  05/24/20 230 lb (104.3 kg)  07/18/19 248 lb 4 oz (112.6 kg)     ASSESSMENT AND PLAN:  Problem List Items Addressed This Visit      Cardiology Problems   Coronary atherosclerosis of native coronary artery   Hyperlipidemia     Other   Smoker    Other Visit Diagnoses    Coronary artery disease of native artery of native heart with stable angina pectoris (HCC)    -  Primary     Shortness of breath Smoking cessation recommended, continued weight loss, walking program  Smoker 1 pack/day 1 pack/day We have encouraged  her to continue to work on weaning her cigarettes and smoking cessation.  Discussed complications such as COPD  Hyperlipidemia Continue high intensity statin with Zetia Goal LDL less than 70  Morbid obesity We have encouraged continued exercise, careful diet management in an effort to lose weight.   Total encounter time more than 25 minutes  Greater than 50% was spent in counseling and coordination of care with the patient    Signed, Esmond Plants, M.D., Ph.D. [place], [place]

## 2020-11-06 ENCOUNTER — Ambulatory Visit: Payer: 59 | Admitting: Cardiovascular Disease

## 2020-11-06 ENCOUNTER — Other Ambulatory Visit: Payer: Self-pay

## 2020-11-06 ENCOUNTER — Encounter: Payer: Self-pay | Admitting: Cardiovascular Disease

## 2020-11-06 VITALS — BP 150/80 | HR 80 | Ht 63.0 in | Wt 223.0 lb

## 2020-11-06 DIAGNOSIS — I25118 Atherosclerotic heart disease of native coronary artery with other forms of angina pectoris: Secondary | ICD-10-CM

## 2020-11-06 DIAGNOSIS — F17200 Nicotine dependence, unspecified, uncomplicated: Secondary | ICD-10-CM

## 2020-11-06 DIAGNOSIS — E782 Mixed hyperlipidemia: Secondary | ICD-10-CM

## 2020-11-06 NOTE — Patient Instructions (Signed)
Medication Instructions:  No changes  If you need a refill on your cardiac medications before your next appointment, please call your pharmacy.    Lab work: No new labs needed   If you have labs (blood work) drawn today and your tests are completely normal, you will receive your results only by: . MyChart Message (if you have MyChart) OR . A paper copy in the mail If you have any lab test that is abnormal or we need to change your treatment, we will call you to review the results.   Testing/Procedures: No new testing needed   Follow-Up: At [facility], you and your health needs are our priority.  As part of our continuing mission to provide you with exceptional heart care, we have created designated Provider Care Teams.  These Care Teams include your primary Cardiologist (physician) and Advanced Practice Providers (APPs -  Physician Assistants and Nurse Practitioners) who all work together to provide you with the care you need, when you need it.  . You will need a follow up appointment in 12 months  . Providers on your designated Care Team:   . Christopher Berge, NP . Ryan Dunn, PA-C . Jacquelyn Visser, PA-C  Any Other Special Instructions Will Be Listed Below (If Applicable).  COVID-19 Vaccine Information can be found at: [email or web address] For questions related to vaccine distribution or appointments, please email [email or web address] or call [phone].     

## 2020-11-08 DIAGNOSIS — I700 Atherosclerosis of aorta: Secondary | ICD-10-CM | POA: Diagnosis not present

## 2020-11-08 DIAGNOSIS — J449 Chronic obstructive pulmonary disease, unspecified: Secondary | ICD-10-CM | POA: Diagnosis not present

## 2020-11-08 DIAGNOSIS — Z0001 Encounter for general adult medical examination with abnormal findings: Secondary | ICD-10-CM | POA: Diagnosis not present

## 2020-11-08 DIAGNOSIS — Z23 Encounter for immunization: Secondary | ICD-10-CM | POA: Diagnosis not present

## 2020-11-08 DIAGNOSIS — E538 Deficiency of other specified B group vitamins: Secondary | ICD-10-CM | POA: Diagnosis not present

## 2020-11-08 DIAGNOSIS — Z6841 Body Mass Index (BMI) 40.0 and over, adult: Secondary | ICD-10-CM | POA: Diagnosis not present

## 2020-11-08 DIAGNOSIS — Z1331 Encounter for screening for depression: Secondary | ICD-10-CM | POA: Diagnosis not present

## 2020-11-09 ENCOUNTER — Other Ambulatory Visit (HOSPITAL_COMMUNITY)
Admission: RE | Admit: 2020-11-09 | Discharge: 2020-11-09 | Disposition: A | Discharge status: Home / Self Care | Payer: 59 | Source: Ambulatory Visit | Attending: Internal Medicine | Admitting: Internal Medicine

## 2020-11-09 DIAGNOSIS — Z6841 Body Mass Index (BMI) 40.0 and over, adult: Secondary | ICD-10-CM | POA: Diagnosis not present

## 2020-11-09 DIAGNOSIS — Z1389 Encounter for screening for other disorder: Secondary | ICD-10-CM | POA: Diagnosis not present

## 2020-11-09 DIAGNOSIS — E538 Deficiency of other specified B group vitamins: Secondary | ICD-10-CM | POA: Diagnosis not present

## 2020-11-09 DIAGNOSIS — Z23 Encounter for immunization: Secondary | ICD-10-CM | POA: Diagnosis not present

## 2020-11-09 LAB — LIPID PANEL
Cholesterol: 163 mg/dL (ref 0–200)
HDL: 55 mg/dL (ref >40)
LDL Cholesterol: 84 mg/dL (ref 0–99)
Total CHOL/HDL Ratio: 3 RATIO
Triglycerides: 122 mg/dL (ref <150)
VLDL: 24 mg/dL (ref 0–40)

## 2020-11-09 LAB — CBC WITH DIFFERENTIAL/PLATELET
Abs Immature Granulocytes: 0.02 10*3/uL (ref 0.00–0.07)
Basophils Absolute: 0.1 10*3/uL (ref 0.0–0.1)
Basophils Relative: 1 %
Eosinophils Absolute: 0.2 10*3/uL (ref 0.0–0.5)
Eosinophils Relative: 3 %
HCT: 41.9 % (ref 36.0–46.0)
Hemoglobin: 13.7 g/dL (ref 12.0–15.0)
Immature Granulocytes: 0 %
Lymphocytes Relative: 29 %
Lymphs Abs: 2 10*3/uL (ref 0.7–4.0)
MCH: 30.1 pg (ref 26.0–34.0)
MCHC: 32.7 g/dL (ref 30.0–36.0)
MCV: 92.1 fL (ref 80.0–100.0)
Monocytes Absolute: 0.5 10*3/uL (ref 0.1–1.0)
Monocytes Relative: 7 %
Neutro Abs: 4.3 10*3/uL (ref 1.7–7.7)
Neutrophils Relative %: 60 %
Platelets: 304 10*3/uL (ref 150–400)
RBC: 4.55 MIL/uL (ref 3.87–5.11)
RDW: 13.2 % (ref 11.5–15.5)
WBC: 7.1 10*3/uL (ref 4.0–10.5)
nRBC: 0 % (ref 0.0–0.2)

## 2020-11-09 LAB — COMPREHENSIVE METABOLIC PANEL
ALT: 17 U/L (ref 0–44)
AST: 18 U/L (ref 15–41)
Albumin: 4.2 g/dL (ref 3.5–5.0)
Alkaline Phosphatase: 101 U/L (ref 38–126)
Anion gap: 7 (ref 5–15)
BUN: 14 mg/dL (ref 6–20)
CO2: 27 mmol/L (ref 22–32)
Calcium: 9.1 mg/dL (ref 8.9–10.3)
Chloride: 104 mmol/L (ref 98–111)
Creatinine, Ser: 0.63 mg/dL (ref 0.44–1.00)
GFR, Estimated: >60 mL/min (ref >60)
Glucose, Bld: 115 mg/dL — ABNORMAL HIGH (ref 70–99)
Potassium: 4.1 mmol/L (ref 3.5–5.1)
Sodium: 138 mmol/L (ref 135–145)
Total Bilirubin: 0.4 mg/dL (ref 0.3–1.2)
Total Protein: 7.4 g/dL (ref 6.5–8.1)

## 2020-11-09 LAB — FOLATE: Folate: 6.2 ng/mL (ref >5.9)

## 2020-11-09 LAB — TSH: TSH: 3.633 u[IU]/mL (ref 0.350–4.500)

## 2020-11-09 LAB — VITAMIN D 25 HYDROXY (VIT D DEFICIENCY, FRACTURES): Vit D, 25-Hydroxy: 14.05 ng/mL — ABNORMAL LOW (ref 30–100)

## 2020-11-09 LAB — CORTISOL: Cortisol, Plasma: 7.5 ug/dL

## 2020-11-12 LAB — VITAMIN B12: Vitamin B-12: 558 pg/mL (ref 180–914)

## 2020-11-15 ENCOUNTER — Other Ambulatory Visit (HOSPITAL_COMMUNITY): Payer: Self-pay | Admitting: Internal Medicine

## 2020-11-15 MED FILL — VIT D2 1.25 MG (50,000 UNIT: 1.25 MG | 84 days supply | Qty: 12 | Fill #0

## 2020-11-26 ENCOUNTER — Other Ambulatory Visit (HOSPITAL_COMMUNITY): Payer: Self-pay

## 2020-11-26 MED ORDER — ROSUVASTATIN CALCIUM 40 MG PO TABS
40.0000 mg | ORAL_TABLET | Freq: Every day | ORAL | 3 refills | Status: DC
  Filled 2020-11-26 – 2021-01-24 (×2): qty 90, 90d supply, fill #0

## 2020-11-26 MED ORDER — VITAMIN D (ERGOCALCIFEROL) 1.25 MG (50000 UNIT) PO CAPS
1.0000 | ORAL_CAPSULE | ORAL | 0 refills | Status: DC
  Filled 2020-11-26: qty 12, 84d supply, fill #0

## 2020-11-28 DIAGNOSIS — M9903 Segmental and somatic dysfunction of lumbar region: Secondary | ICD-10-CM | POA: Diagnosis not present

## 2020-11-28 DIAGNOSIS — M9905 Segmental and somatic dysfunction of pelvic region: Secondary | ICD-10-CM | POA: Diagnosis not present

## 2020-11-28 DIAGNOSIS — M461 Sacroiliitis, not elsewhere classified: Secondary | ICD-10-CM | POA: Diagnosis not present

## 2020-11-28 DIAGNOSIS — M9902 Segmental and somatic dysfunction of thoracic region: Secondary | ICD-10-CM | POA: Diagnosis not present

## 2020-11-30 DIAGNOSIS — M9905 Segmental and somatic dysfunction of pelvic region: Secondary | ICD-10-CM | POA: Diagnosis not present

## 2020-11-30 DIAGNOSIS — M9902 Segmental and somatic dysfunction of thoracic region: Secondary | ICD-10-CM | POA: Diagnosis not present

## 2020-11-30 DIAGNOSIS — M9903 Segmental and somatic dysfunction of lumbar region: Secondary | ICD-10-CM | POA: Diagnosis not present

## 2020-11-30 DIAGNOSIS — M461 Sacroiliitis, not elsewhere classified: Secondary | ICD-10-CM | POA: Diagnosis not present

## 2020-12-03 DIAGNOSIS — M9903 Segmental and somatic dysfunction of lumbar region: Secondary | ICD-10-CM | POA: Diagnosis not present

## 2020-12-03 DIAGNOSIS — M9902 Segmental and somatic dysfunction of thoracic region: Secondary | ICD-10-CM | POA: Diagnosis not present

## 2020-12-03 DIAGNOSIS — M9905 Segmental and somatic dysfunction of pelvic region: Secondary | ICD-10-CM | POA: Diagnosis not present

## 2020-12-03 DIAGNOSIS — M461 Sacroiliitis, not elsewhere classified: Secondary | ICD-10-CM | POA: Diagnosis not present

## 2020-12-04 ENCOUNTER — Other Ambulatory Visit (HOSPITAL_COMMUNITY): Payer: Self-pay

## 2020-12-05 ENCOUNTER — Other Ambulatory Visit (HOSPITAL_COMMUNITY): Payer: Self-pay

## 2020-12-05 DIAGNOSIS — M9905 Segmental and somatic dysfunction of pelvic region: Secondary | ICD-10-CM | POA: Diagnosis not present

## 2020-12-05 DIAGNOSIS — M9902 Segmental and somatic dysfunction of thoracic region: Secondary | ICD-10-CM | POA: Diagnosis not present

## 2020-12-05 DIAGNOSIS — M461 Sacroiliitis, not elsewhere classified: Secondary | ICD-10-CM | POA: Diagnosis not present

## 2020-12-05 DIAGNOSIS — E539 Vitamin B deficiency, unspecified: Secondary | ICD-10-CM | POA: Diagnosis not present

## 2020-12-05 DIAGNOSIS — M9903 Segmental and somatic dysfunction of lumbar region: Secondary | ICD-10-CM | POA: Diagnosis not present

## 2021-01-04 ENCOUNTER — Other Ambulatory Visit (HOSPITAL_COMMUNITY)
Admission: RE | Admit: 2021-01-04 | Discharge: 2021-01-04 | Disposition: A | Discharge status: Home / Self Care | Payer: 59 | Source: Other Acute Inpatient Hospital | Attending: Internal Medicine | Admitting: Internal Medicine

## 2021-01-04 DIAGNOSIS — Z1389 Encounter for screening for other disorder: Secondary | ICD-10-CM | POA: Diagnosis not present

## 2021-01-04 DIAGNOSIS — E538 Deficiency of other specified B group vitamins: Secondary | ICD-10-CM | POA: Insufficient documentation

## 2021-01-04 LAB — LIPID PANEL
Cholesterol: 138 mg/dL (ref 0–200)
HDL: 51 mg/dL (ref >40)
LDL Cholesterol: 55 mg/dL (ref 0–99)
Total CHOL/HDL Ratio: 2.7 RATIO
Triglycerides: 162 mg/dL — ABNORMAL HIGH (ref <150)
VLDL: 32 mg/dL (ref 0–40)

## 2021-01-04 LAB — VITAMIN B12: Vitamin B-12: 332 pg/mL (ref 180–914)

## 2021-01-04 LAB — HEMOGLOBIN A1C
Hgb A1c MFr Bld: 6.1 % — ABNORMAL HIGH (ref 4.8–5.6)
Mean Plasma Glucose: 128.37 mg/dL

## 2021-01-04 LAB — CORTISOL: Cortisol, Plasma: 8.7 ug/dL

## 2021-01-14 ENCOUNTER — Other Ambulatory Visit (HOSPITAL_COMMUNITY): Payer: Self-pay

## 2021-01-14 MED FILL — Albuterol Sulfate Inhal Aero 108 MCG/ACT (90MCG Base Equiv): RESPIRATORY_TRACT | 50 days supply | Qty: 25.5 | Fill #0 | Status: AC

## 2021-01-15 ENCOUNTER — Other Ambulatory Visit (HOSPITAL_COMMUNITY): Payer: Self-pay

## 2021-01-21 ENCOUNTER — Other Ambulatory Visit (HOSPITAL_COMMUNITY): Payer: Self-pay

## 2021-01-22 ENCOUNTER — Other Ambulatory Visit (HOSPITAL_COMMUNITY): Payer: Self-pay

## 2021-01-22 MED ORDER — AMLODIPINE BESYLATE 10 MG PO TABS
10.0000 mg | ORAL_TABLET | Freq: Every day | ORAL | 3 refills | Status: DC
  Filled 2021-01-22 – 2021-06-06 (×2): qty 90, 90d supply, fill #0
  Filled 2021-09-18: qty 90, 90d supply, fill #1

## 2021-01-23 ENCOUNTER — Other Ambulatory Visit (HOSPITAL_COMMUNITY): Payer: Self-pay

## 2021-01-24 ENCOUNTER — Other Ambulatory Visit (HOSPITAL_COMMUNITY): Payer: Self-pay

## 2021-03-22 ENCOUNTER — Other Ambulatory Visit (HOSPITAL_COMMUNITY): Payer: Self-pay

## 2021-03-22 DIAGNOSIS — J449 Chronic obstructive pulmonary disease, unspecified: Secondary | ICD-10-CM | POA: Diagnosis not present

## 2021-03-22 DIAGNOSIS — Z6839 Body mass index (BMI) 39.0-39.9, adult: Secondary | ICD-10-CM | POA: Diagnosis not present

## 2021-03-22 DIAGNOSIS — M47816 Spondylosis without myelopathy or radiculopathy, lumbar region: Secondary | ICD-10-CM | POA: Diagnosis not present

## 2021-03-22 DIAGNOSIS — M5416 Radiculopathy, lumbar region: Secondary | ICD-10-CM | POA: Diagnosis not present

## 2021-03-22 MED ORDER — TRAMADOL HCL 50 MG PO TABS
50.0000 mg | ORAL_TABLET | Freq: Four times a day (QID) | ORAL | 2 refills | Status: DC | PRN
  Filled 2021-03-22 – 2021-07-12 (×2): qty 40, 5d supply, fill #0
  Filled 2021-08-14: qty 40, 5d supply, fill #1

## 2021-03-22 MED ORDER — METHOCARBAMOL 500 MG PO TABS
500.0000 mg | ORAL_TABLET | Freq: Two times a day (BID) | ORAL | 1 refills | Status: DC | PRN
  Filled 2021-03-22: qty 16, 8d supply, fill #0
  Filled 2021-03-22: qty 24, 12d supply, fill #0
  Filled 2021-07-12: qty 40, 20d supply, fill #0

## 2021-03-26 ENCOUNTER — Other Ambulatory Visit: Payer: Self-pay | Admitting: Internal Medicine

## 2021-03-26 DIAGNOSIS — F17210 Nicotine dependence, cigarettes, uncomplicated: Secondary | ICD-10-CM

## 2021-04-05 ENCOUNTER — Other Ambulatory Visit (HOSPITAL_COMMUNITY): Payer: Self-pay

## 2021-04-08 ENCOUNTER — Other Ambulatory Visit (HOSPITAL_COMMUNITY): Payer: Self-pay

## 2021-04-08 MED ORDER — IPRATROPIUM-ALBUTEROL 0.5-2.5 (3) MG/3ML IN SOLN
3.0000 mL | Freq: Four times a day (QID) | RESPIRATORY_TRACT | 4 refills | Status: DC
  Filled 2021-04-08: qty 270, 23d supply, fill #0
  Filled 2022-02-10: qty 270, 23d supply, fill #1

## 2021-04-08 MED ORDER — ALBUTEROL SULFATE HFA 108 (90 BASE) MCG/ACT IN AERS
1.0000 | INHALATION_SPRAY | RESPIRATORY_TRACT | 10 refills | Status: DC | PRN
  Filled 2021-04-08: qty 25.5, 75d supply, fill #0
  Filled 2021-06-06: qty 25.5, 50d supply, fill #0
  Filled 2021-11-20: qty 25.5, 50d supply, fill #1
  Filled 2022-02-10: qty 20.1, 75d supply, fill #2

## 2021-04-10 ENCOUNTER — Ambulatory Visit
Admission: RE | Admit: 2021-04-10 | Discharge: 2021-04-10 | Disposition: A | Discharge status: Home / Self Care | Payer: 59 | Source: Ambulatory Visit | Attending: Internal Medicine | Admitting: Internal Medicine

## 2021-04-10 ENCOUNTER — Other Ambulatory Visit: Payer: Self-pay

## 2021-04-10 DIAGNOSIS — F17210 Nicotine dependence, cigarettes, uncomplicated: Secondary | ICD-10-CM | POA: Insufficient documentation

## 2021-04-15 ENCOUNTER — Other Ambulatory Visit: Payer: Self-pay | Admitting: Family Medicine

## 2021-04-15 DIAGNOSIS — E041 Nontoxic single thyroid nodule: Secondary | ICD-10-CM

## 2021-04-23 ENCOUNTER — Other Ambulatory Visit: Payer: Self-pay

## 2021-04-23 ENCOUNTER — Ambulatory Visit
Admission: RE | Admit: 2021-04-23 | Discharge: 2021-04-23 | Disposition: A | Discharge status: Home / Self Care | Payer: 59 | Source: Ambulatory Visit | Attending: Family Medicine | Admitting: Family Medicine

## 2021-04-23 DIAGNOSIS — E041 Nontoxic single thyroid nodule: Secondary | ICD-10-CM | POA: Insufficient documentation

## 2021-06-06 ENCOUNTER — Other Ambulatory Visit (HOSPITAL_COMMUNITY): Payer: Self-pay

## 2021-06-07 ENCOUNTER — Other Ambulatory Visit (HOSPITAL_COMMUNITY): Payer: Self-pay

## 2021-07-05 ENCOUNTER — Other Ambulatory Visit (HOSPITAL_COMMUNITY): Payer: Self-pay

## 2021-07-12 ENCOUNTER — Other Ambulatory Visit (HOSPITAL_COMMUNITY): Payer: Self-pay

## 2021-07-15 ENCOUNTER — Other Ambulatory Visit (HOSPITAL_COMMUNITY): Payer: Self-pay | Admitting: Internal Medicine

## 2021-07-15 ENCOUNTER — Other Ambulatory Visit: Payer: Self-pay | Admitting: Internal Medicine

## 2021-07-15 DIAGNOSIS — M545 Low back pain, unspecified: Secondary | ICD-10-CM

## 2021-07-30 ENCOUNTER — Ambulatory Visit
Admission: RE | Admit: 2021-07-30 | Discharge: 2021-07-30 | Disposition: A | Discharge status: Home / Self Care | Payer: 59 | Source: Ambulatory Visit | Attending: Internal Medicine | Admitting: Internal Medicine

## 2021-07-30 ENCOUNTER — Other Ambulatory Visit: Payer: Self-pay

## 2021-07-30 DIAGNOSIS — M47816 Spondylosis without myelopathy or radiculopathy, lumbar region: Secondary | ICD-10-CM | POA: Diagnosis not present

## 2021-07-30 DIAGNOSIS — M545 Low back pain, unspecified: Secondary | ICD-10-CM | POA: Diagnosis not present

## 2021-08-14 ENCOUNTER — Other Ambulatory Visit (HOSPITAL_COMMUNITY): Payer: Self-pay

## 2021-08-14 MED ORDER — METHOCARBAMOL 500 MG PO TABS
500.0000 mg | ORAL_TABLET | Freq: Two times a day (BID) | ORAL | 1 refills | Status: DC | PRN
  Filled 2021-08-14: qty 40, 20d supply, fill #0
  Filled 2021-09-18: qty 40, 20d supply, fill #1

## 2021-09-19 ENCOUNTER — Other Ambulatory Visit (HOSPITAL_COMMUNITY): Payer: Self-pay

## 2021-11-20 ENCOUNTER — Other Ambulatory Visit (HOSPITAL_COMMUNITY): Payer: Self-pay

## 2021-11-21 ENCOUNTER — Other Ambulatory Visit (HOSPITAL_COMMUNITY): Payer: Self-pay

## 2021-11-22 ENCOUNTER — Other Ambulatory Visit (HOSPITAL_COMMUNITY): Payer: Self-pay

## 2021-12-02 DIAGNOSIS — H524 Presbyopia: Secondary | ICD-10-CM | POA: Diagnosis not present

## 2021-12-07 NOTE — Progress Notes (Signed)
Cardiology Office Note ? ?Date:  12/09/2021  ? ?Laura Gillespie, DOB [birth year], MRN [id number] ? ?PCP:  Redmond School, MD  ? ?Chief Complaint  ?Patient presents with  ? 12 month follow up   ?  "Doing well." Medications reviewed by the patient verbally.   ? ? ?HPI:  ?Laura Gillespie is a very pleasant 61 year old woman with  ?long history of smoking,  ?obesity,  ?hyperlipidemia,  ?Coronary artery disease ?hospital 01/07/2014 with chest pain, angina, non-STEMI. ?catheterization  showed severe distal RCA disease.  ?Bare-metal stent  ?She presents  forfollow-up of her coronary artery disease ? 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 ?Not on crestor, leg pain, did not restart the medication ?Back issue likely contributing to leg pain not the statin ? ?Still smoking, would like to quit ? ?Works in [place], [employer]arry manager ? ?Eating less, "stress" ? ?No regular exercise program or ?Denies chest pain concerning for angina ? ?EKG personally reviewed by myself on todays visit ?Normal sinus rhythm rate 83 bpm right bundle branch block, left anterior fascicular block ? ?Other past medical history reviewed ?Echocardiogram in the hospital was essentially normal 2013-11-14 ?  ?Cardiac catheterization Nov 14, 2013 ?showed 99% distal RCA disease, down to 0% residual disease following the stent placement ?  ? ?PMH:   has a past medical history of Acute non-ST segment elevation myocardial infarction [facility]), Allergic rhinitis due to pollen, Anxiety disorder, unspecified, Atherosclerotic heart disease of native coronary artery without angina pectoris, Bronchitis, not specified as acute or chronic, Chronic obstructive pulmonary disease with (acute) exacerbation ([place]), Coronary artery disease (12/2013), Essential (primary) hypertension, Hyperlipidemia, Hyperlipidemia, unspecified, Hypertension, Nicotine dependence, unspecified, uncomplicated, and Other  hypersomnia. ? ?PSH:    ?Past Surgical History:  ?Procedure Laterality Date  ? CARDIAC CATHETERIZATION    ? CARPAL TUNNEL RELEASE    ? both hand  ? CHOLECYSTECTOMY    ? COLONOSCOPY N/A 05/24/2020  ? Procedure: COLONOSCOPY;  Surgeon: Rogene Houston, MD;  [facility];  Service: Endoscopy;  Laterality: N/A;  1030  ? CORONARY ANGIOPLASTY  12/2013  ? stent placement to the distal RCA  ? LOBECTOMY Right 1991  ? LUNG BIOPSY    ? TONSILLECTOMY    ? TUBAL LIGATION    ? ? ?Current Outpatient Medications  ?Medication Sig Dispense Refill  ? acetaminophen (TYLENOL) 500 MG tablet Take 1,000 mg by mouth every 6 (six) hours as needed for moderate pain.     ? albuterol (PROVENTIL HFA;VENTOLIN HFA) 108 (90 Base) MCG/ACT inhaler Inhale 1-2 puffs into the lungs every 6 (six) hours as needed for wheezing or shortness of breath. 1 Inhaler 0  ? amLODipine (NORVASC) 10 MG tablet Take 10 mg by mouth daily.    ? dextromethorphan-guaiFENesin (MUCINEX DM) 30-600 MG 12hr tablet Take 1 tablet by mouth 2 (two) times daily.    ? ibuprofen (ADVIL) 200 MG tablet Take 400 mg by mouth every 6 (six) hours as needed for moderate pain.    ? ipratropium-albuterol (DUONEB) 0.5-2.5 (3) MG/3ML SOLN USE 1 VIAL (3ML) VIA NEBULIZER 4 TIMES A DAY 270 mL 0  ? pseudoephedrine (SUDAFED) 120 MG 12 hr tablet Take 120 mg by mouth daily as needed for congestion.    ? albuterol (VENTOLIN HFA) 108 (90 Base) MCG/ACT inhaler INHALE 1 TO 2 PUFFS BY MOUTH EVERY 4 HOURS AS NEEDED (Patient not taking: Reported on 12/09/2021)  25.5 g 3  ? albuterol (VENTOLIN HFA) 108 (90 Base) MCG/ACT inhaler Inhale 1-2 puffs into the lungs every 4 (four) hours as needed. (Patient not taking: Reported on 12/09/2021) 25.5 g 10  ? ipratropium-albuterol (DUONEB) 0.5-2.5 (3) MG/3ML SOLN Take 3 mLs by nebulization 4 (four) times daily. (Patient not taking: Reported on 12/09/2021) 270 mL 4  ? methocarbamol (ROBAXIN) 500 MG tablet Take 1 tablet (500 mg total) by mouth 2 (two) times daily as  needed for spasm (Patient not taking: Reported on 12/09/2021) 40 tablet 1  ? rosuvastatin (CRESTOR) 40 MG tablet Take 1 tablet (40 mg) by mouth once daily (Patient not taking: Reported on 12/09/2021) 90 tablet 3  ? traMADol (ULTRAM) 50 MG tablet Take 1-2 tablets (50-100 mg total) by mouth 4 (four) times daily as needed for pain (Patient not taking: Reported on 12/09/2021) 40 tablet 2  ? Vitamin D, Ergocalciferol, (DRISDOL) 1.25 MG (50000 UNIT) CAPS capsule Take 1 capsule by mouth once weekly.  Once this prescription is complete then patient is to take 2000 units over the counter daily. (Patient not taking: Reported on 12/09/2021) 12 capsule 0  ? ?No current facility-administered medications for this visit.  ? ? ? ?Allergies:   Morphine and related and Percocet [oxycodone-acetaminophen]  ? ?Social History:  The patient  reports that she has been smoking cigarettes. She has a 35.00 pack-year smoking history. She has never used smokeless tobacco. She reports that she does not drink alcohol and does not use drugs.  ? ?Family History:   family history includes Heart attack (age of onset: 40) in her father; Heart disease in her father; Hyperlipidemia in her father and mother; Hypertension in her father and mother.  ? ? ?Review of Systems: ?Review of Systems  ?Constitutional: Negative.   ?HENT: Negative.    ?Respiratory: Negative.    ?Cardiovascular: Negative.   ?Gastrointestinal: Negative.   ?Musculoskeletal: Negative.   ?Neurological: Negative.   ?Psychiatric/Behavioral: Negative.    ?All other systems reviewed and are negative. ? ?PHYSICAL EXAM: ?VS:  BP 140/80 (BP Location: Left Arm, Patient Position: Sitting, Cuff Size: Normal)   Pulse 83   Ht '5\' 3"'$  (1.6 m)   Wt 84.1 kg   SpO2 98%   BMI 32.86 kg/m?  , BMI Body mass index is 32.86 kg/m?Marland Kitchen ?Constitutional:  oriented to person, place, and time. No distress.  ?HENT:  ?Head: Grossly normal ?Eyes:  no discharge. No scleral icterus.  ?Neck: No JVD, no carotid bruits   ?Cardiovascular: Regular rate and rhythm, no murmurs appreciated ?Pulmonary/Chest: Clear to auscultation bilaterally, no wheezes or rails ?Abdominal: Soft.  no distension.  no tenderness.  ?Musculoskeletal: Normal range of motion ?Neurological:  normal muscle tone. Coordination normal. No atrophy ?Skin: Skin warm and dry ?Psychiatric: normal affect, pleasant ? ?Recent Labs: ?No results found for requested labs within last 8760 hours.  ? ? ?Lipid Panel ?Lab Results  ?Component Value Date  ? CHOL 138 01/04/2021  ? HDL 51 01/04/2021  ? [place] 55 01/04/2021  ? TRIG 162 (H) 01/04/2021  ? ?  ? ?Wt Readings from Last 3 Encounters:  ?12/09/21 84.1 kg  ?07/30/21 101.2 kg  ?11/06/20 101.2 kg  ?  ? ?ASSESSMENT AND PLAN: ? ?Problem List Items Addressed This Visit   ? ? Coronary atherosclerosis of native coronary artery  ? Hyperlipidemia  ? Smoker  ? SOB (shortness of breath)  ? Relevant Orders  ? EKG 12-Lead  ? ?Other Visit Diagnoses   ? ? Coronary artery  disease of native artery of native heart with stable angina pectoris ([place])    -  Primary  ? Relevant Orders  ? EKG 12-Lead  ? ?  ? ?Shortness of breath ?Smoking cessation recommended, continued weight loss, walking program ?Weight is trending down ?We have prescribed Chantix ? ?Smoker ?1 pack/day 1 pack/day ?Discussed complications such as COPD, coronary disease ?She has declined both ? ?Hyperlipidemia ?Recommend she restart her Crestor ?Goal LDL less than 70 ? ?Morbid obesity ?Weight is trending down, eating less, dietary changes leading to great weight loss ? ? Total encounter time more than 30 minutes ? Greater than 50% was spent in counseling and coordination of care with the patient ? ? ? ?Signed, ?Esmond Plants, M.D., Ph.D. ?[facility] [place], [place] ?[phone] ?

## 2021-12-09 ENCOUNTER — Encounter: Payer: Self-pay | Admitting: Cardiovascular Disease

## 2021-12-09 ENCOUNTER — Ambulatory Visit: Payer: 59 | Admitting: Cardiovascular Disease

## 2021-12-09 ENCOUNTER — Other Ambulatory Visit (HOSPITAL_COMMUNITY): Payer: Self-pay

## 2021-12-09 VITALS — BP 140/80 | HR 83 | Ht 63.0 in | Wt 185.5 lb

## 2021-12-09 DIAGNOSIS — E782 Mixed hyperlipidemia: Secondary | ICD-10-CM | POA: Diagnosis not present

## 2021-12-09 DIAGNOSIS — R0602 Shortness of breath: Secondary | ICD-10-CM

## 2021-12-09 DIAGNOSIS — I25118 Atherosclerotic heart disease of native coronary artery with other forms of angina pectoris: Secondary | ICD-10-CM | POA: Diagnosis not present

## 2021-12-09 DIAGNOSIS — F17200 Nicotine dependence, unspecified, uncomplicated: Secondary | ICD-10-CM

## 2021-12-09 MED ORDER — VARENICLINE TARTRATE 1 MG PO TABS
1.0000 mg | ORAL_TABLET | Freq: Two times a day (BID) | ORAL | 3 refills | Status: DC
  Filled 2021-12-09: qty 60, 30d supply, fill #0
  Filled 2021-12-10: qty 56, 28d supply, fill #0
  Filled 2022-02-10: qty 56, 28d supply, fill #1

## 2021-12-09 MED ORDER — ROSUVASTATIN CALCIUM 40 MG PO TABS
40.0000 mg | ORAL_TABLET | Freq: Every day | ORAL | 3 refills | Status: DC
  Filled 2021-12-09 – 2021-12-10 (×2): qty 90, 90d supply, fill #0
  Filled 2022-02-10 – 2022-03-17 (×2): qty 90, 90d supply, fill #1
  Filled 2022-07-03: qty 90, 90d supply, fill #2
  Filled 2022-09-29: qty 90, 90d supply, fill #3

## 2021-12-09 NOTE — Patient Instructions (Addendum)
Medication Instructions:  ?- Your physician has recommended you make the following change in your medication:  ? ?1) RESTART crestor (rosuvastatin) 40 mg: ?- take 1 tablet by mouth once daily  ? ? ?2) START Chantix 1 mg: ?-take 1 tablet by mouth twice daily (or every 12 hours)  for smoking cessation ? ?If you need a refill on your cardiac medications before your next appointment, please call your pharmacy.  ? ? ?Lab work: ?No new labs needed ? ? ?Testing/Procedures: ?No new testing needed ? ? ?Follow-Up: ?At [facility], you and your health needs are our priority.  As part of our continuing mission to provide you with exceptional heart care, we have created designated Provider Care Teams.  These Care Teams include your primary Cardiologist (physician) and Advanced Practice Providers (APPs -  Physician Assistants and Nurse Practitioners) who all work together to provide you with the care you need, when you need it. ? ?You will need a follow up appointment in 12 months ? ?Providers on your designated Care Team:   ?Murray Hodgkins, NP ?Christell Faith, PA-C ?Cadence Kathlen Mody, PA-C ? ?COVID-19 Vaccine Information can be found at: [email or web address] For questions related to vaccine distribution or appointments, please email [email or web address] or call [phone].  ? ?

## 2021-12-10 ENCOUNTER — Other Ambulatory Visit (HOSPITAL_COMMUNITY): Payer: Self-pay

## 2021-12-10 ENCOUNTER — Other Ambulatory Visit: Payer: Self-pay

## 2021-12-12 DIAGNOSIS — M5416 Radiculopathy, lumbar region: Secondary | ICD-10-CM | POA: Diagnosis not present

## 2021-12-12 DIAGNOSIS — R1030 Lower abdominal pain, unspecified: Secondary | ICD-10-CM | POA: Diagnosis not present

## 2021-12-12 DIAGNOSIS — M5136 Other intervertebral disc degeneration, lumbar region: Secondary | ICD-10-CM | POA: Diagnosis not present

## 2021-12-12 DIAGNOSIS — R1032 Left lower quadrant pain: Secondary | ICD-10-CM | POA: Diagnosis not present

## 2021-12-26 ENCOUNTER — Other Ambulatory Visit: Payer: Self-pay

## 2021-12-26 MED ORDER — AMLODIPINE BESYLATE 10 MG PO TABS
10.0000 mg | ORAL_TABLET | Freq: Every day | ORAL | 3 refills | Status: DC
  Filled 2021-12-26: qty 90, 90d supply, fill #0
  Filled 2022-02-10 – 2022-03-25 (×2): qty 90, 90d supply, fill #1
  Filled 2022-07-03: qty 90, 90d supply, fill #2
  Filled 2022-09-29: qty 90, 90d supply, fill #3

## 2022-01-23 ENCOUNTER — Other Ambulatory Visit: Payer: Self-pay

## 2022-01-23 MED ORDER — AMITRIPTYLINE HCL 25 MG PO TABS
ORAL_TABLET | ORAL | 10 refills | Status: AC
  Filled 2022-01-23: qty 60, 30d supply, fill #0
  Filled 2022-02-10: qty 60, 30d supply, fill #1
  Filled 2022-03-19: qty 60, 30d supply, fill #2
  Filled 2022-05-11: qty 60, 30d supply, fill #3

## 2022-01-24 ENCOUNTER — Other Ambulatory Visit
Admission: RE | Admit: 2022-01-24 | Discharge: 2022-01-24 | Disposition: A | Discharge status: Home / Self Care | Payer: 59 | Attending: Internal Medicine | Admitting: Internal Medicine

## 2022-01-24 DIAGNOSIS — R7309 Other abnormal glucose: Secondary | ICD-10-CM | POA: Diagnosis not present

## 2022-01-24 DIAGNOSIS — Z6836 Body mass index (BMI) 36.0-36.9, adult: Secondary | ICD-10-CM | POA: Insufficient documentation

## 2022-01-24 DIAGNOSIS — E7849 Other hyperlipidemia: Secondary | ICD-10-CM | POA: Insufficient documentation

## 2022-01-24 DIAGNOSIS — J449 Chronic obstructive pulmonary disease, unspecified: Secondary | ICD-10-CM | POA: Insufficient documentation

## 2022-01-24 LAB — COMPREHENSIVE METABOLIC PANEL
ALT: 15 U/L (ref 0–44)
AST: 18 U/L (ref 15–41)
Albumin: 4.5 g/dL (ref 3.5–5.0)
Alkaline Phosphatase: 89 U/L (ref 38–126)
Anion gap: 10 (ref 5–15)
BUN: 12 mg/dL (ref 6–20)
CO2: 24 mmol/L (ref 22–32)
Calcium: 9.9 mg/dL (ref 8.9–10.3)
Chloride: 99 mmol/L (ref 98–111)
Creatinine, Ser: 0.78 mg/dL (ref 0.44–1.00)
GFR, Estimated: >60 mL/min (ref >60)
Glucose, Bld: 128 mg/dL — ABNORMAL HIGH (ref 70–99)
Potassium: 4.1 mmol/L (ref 3.5–5.1)
Sodium: 133 mmol/L — ABNORMAL LOW (ref 135–145)
Total Bilirubin: 0.4 mg/dL (ref 0.3–1.2)
Total Protein: 7.8 g/dL (ref 6.5–8.1)

## 2022-01-24 LAB — LIPID PANEL
Cholesterol: 174 mg/dL (ref 0–200)
HDL: 82 mg/dL (ref >40)
LDL Cholesterol: 69 mg/dL (ref 0–99)
Total CHOL/HDL Ratio: 2.1 RATIO
Triglycerides: 113 mg/dL (ref <150)
VLDL: 23 mg/dL (ref 0–40)

## 2022-01-24 LAB — CBC WITH DIFFERENTIAL/PLATELET
Abs Immature Granulocytes: 0.03 10*3/uL (ref 0.00–0.07)
Basophils Absolute: 0 10*3/uL (ref 0.0–0.1)
Basophils Relative: 0 %
Eosinophils Absolute: 0 10*3/uL (ref 0.0–0.5)
Eosinophils Relative: 0 %
HCT: 37.7 % (ref 36.0–46.0)
Hemoglobin: 12.8 g/dL (ref 12.0–15.0)
Immature Granulocytes: 0 %
Lymphocytes Relative: 14 %
Lymphs Abs: 1.7 10*3/uL (ref 0.7–4.0)
MCH: 30.2 pg (ref 26.0–34.0)
MCHC: 34 g/dL (ref 30.0–36.0)
MCV: 88.9 fL (ref 80.0–100.0)
Monocytes Absolute: 0.8 10*3/uL (ref 0.1–1.0)
Monocytes Relative: 6 %
Neutro Abs: 9.7 10*3/uL — ABNORMAL HIGH (ref 1.7–7.7)
Neutrophils Relative %: 80 %
Platelets: 312 10*3/uL (ref 150–400)
RBC: 4.24 MIL/uL (ref 3.87–5.11)
RDW: 12.8 % (ref 11.5–15.5)
WBC: 12.3 10*3/uL — ABNORMAL HIGH (ref 4.0–10.5)
nRBC: 0 % (ref 0.0–0.2)

## 2022-01-24 LAB — HEMOGLOBIN A1C
Hgb A1c MFr Bld: 5.8 % — ABNORMAL HIGH (ref 4.8–5.6)
Mean Plasma Glucose: 119.76 mg/dL

## 2022-01-24 LAB — TSH: TSH: 0.803 u[IU]/mL (ref 0.350–4.500)

## 2022-01-24 LAB — VITAMIN B12: Vitamin B-12: 213 pg/mL (ref 180–914)

## 2022-01-24 LAB — VITAMIN D 25 HYDROXY (VIT D DEFICIENCY, FRACTURES): Vit D, 25-Hydroxy: 22.41 ng/mL — ABNORMAL LOW (ref 30–100)

## 2022-02-10 ENCOUNTER — Other Ambulatory Visit: Payer: Self-pay

## 2022-02-11 ENCOUNTER — Other Ambulatory Visit: Payer: Self-pay

## 2022-02-11 MED ORDER — ALBUTEROL SULFATE HFA 108 (90 BASE) MCG/ACT IN AERS
INHALATION_SPRAY | RESPIRATORY_TRACT | 3 refills | Status: DC
  Filled 2022-02-11: qty 20.1, 75d supply, fill #0
  Filled 2022-04-11: qty 54, 75d supply, fill #0
  Filled 2022-07-03: qty 20.1, 75d supply, fill #1
  Filled 2022-09-12: qty 36, 50d supply, fill #2

## 2022-02-11 MED ORDER — IPRATROPIUM-ALBUTEROL 0.5-2.5 (3) MG/3ML IN SOLN
RESPIRATORY_TRACT | 0 refills | Status: AC
  Filled 2022-02-11: qty 270, 25d supply, fill #0

## 2022-02-11 MED ORDER — METHOCARBAMOL 500 MG PO TABS
500.0000 mg | ORAL_TABLET | Freq: Two times a day (BID) | ORAL | 1 refills | Status: DC | PRN
  Filled 2022-02-11: qty 40, 20d supply, fill #0

## 2022-02-17 ENCOUNTER — Other Ambulatory Visit: Payer: Self-pay

## 2022-03-06 ENCOUNTER — Ambulatory Visit
Admission: RE | Admit: 2022-03-06 | Discharge: 2022-03-06 | Disposition: A | Discharge status: Home / Self Care | Payer: 59 | Source: Ambulatory Visit | Attending: Internal Medicine | Admitting: Internal Medicine

## 2022-03-06 ENCOUNTER — Encounter: Payer: 59 | Admitting: Adult Health

## 2022-03-06 VITALS — BP 143/85 | HR 85 | Temp 98.0°F | Resp 18 | Ht 63.0 in | Wt 188.0 lb

## 2022-03-06 DIAGNOSIS — R21 Rash and other nonspecific skin eruption: Secondary | ICD-10-CM

## 2022-03-06 DIAGNOSIS — Z91030 Bee allergy status: Secondary | ICD-10-CM

## 2022-03-06 MED ORDER — METHYLPREDNISOLONE 4 MG PO TBPK: ORAL_TABLET | ORAL | 0 refills | Status: DC

## 2022-03-06 MED ORDER — CYPROHEPTADINE HCL 4 MG PO TABS: 4.0000 mg | ORAL_TABLET | Freq: Three times a day (TID) | ORAL | 0 refills | Status: DC | PRN

## 2022-03-06 NOTE — ED Provider Notes (Addendum)
[facility]    CSN: [id number] Arrival date & time: 03/06/22  1221      History   Chief Complaint Chief Complaint  Patient presents with   Insect Bite    HPI Lovene Maret Glennie is a 61 y.o. female who presents with yellow jacket sting along her face and l hand that occurred yesterday. The swelling is moving up her forearm. She denies but has a lot of itching and benadryl has not helped. Her face is not bothering her.   He has iced the area.    Past Medical History:  Diagnosis Date   Acute non-ST segment elevation myocardial infarction [facility])    Allergic rhinitis due to pollen    Anxiety disorder, unspecified    Atherosclerotic heart disease of native coronary artery without angina pectoris    Bronchitis, not specified as acute or chronic    Chronic obstructive pulmonary disease with (acute) exacerbation (HCC)    Coronary artery disease 12/2013   s/p bare metal stent to distal RCA   Essential (primary) hypertension    Hyperlipidemia    Hyperlipidemia, unspecified    Hypertension    Nicotine dependence, unspecified, uncomplicated    Other hypersomnia     Patient Active Problem List   Diagnosis Date Noted   Morbid obesity ([place]) 10/29/2015   Myalgia 07/17/2014   SOB (shortness of breath) 07/17/2014   S/P right coronary artery (RCA) stent placement 01/17/2014   Coronary atherosclerosis of native coronary artery 01/17/2014   NSTEMI (non-ST elevated myocardial infarction) ([place]) 01/17/2014   Hyperlipidemia 01/17/2014   Smoker 01/17/2014   Upper respiratory infection 01/17/2014    Past Surgical History:  Procedure Laterality Date   CARDIAC CATHETERIZATION     CARPAL TUNNEL RELEASE     both hand   CHOLECYSTECTOMY     COLONOSCOPY N/A 05/24/2020   Procedure: COLONOSCOPY;  Surgeon: Rogene Houston, MD;  Location: [facility];  Service: Endoscopy;  Laterality: N/A;  1030   CORONARY ANGIOPLASTY  12/2013   stent placement to the distal RCA   LOBECTOMY Right  1991   LUNG BIOPSY     TONSILLECTOMY     TUBAL LIGATION      OB History   No obstetric history on file.      Home Medications    Prior to Admission medications   Medication Sig Start Date End Date Taking? Authorizing Provider  acetaminophen (TYLENOL) 500 MG tablet Take 1,000 mg by mouth every 6 (six) hours as needed for moderate pain.    Yes [provider]  albuterol (PROVENTIL HFA;VENTOLIN HFA) 108 (90 Base) MCG/ACT inhaler Inhale 1-2 puffs into the lungs every 6 (six) hours as needed for wheezing or shortness of breath. 01/18/17  Yes Conty, [place], MD  albuterol (VENTOLIN HFA) 108 (90 Base) MCG/ACT inhaler Inhale 1-2 puffs into the lungs every 4 (four) hours as needed. 04/08/21  Yes   albuterol (VENTOLIN HFA) 108 (90 Base) MCG/ACT inhaler INHALE 1 TO 2 PUFFS BY MOUTH EVERY 4 HOURS AS NEEDED 02/11/22 02/11/23 Yes   amitriptyline (ELAVIL) 25 MG tablet Take one tablet by mouth at bedtime for 7 nights; then take 2 tablets by mouth at bedtime thereafter as needed. 01/23/22  Yes   amLODipine (NORVASC) 10 MG tablet Take 1 tablet (10 mg total) by mouth daily. 12/26/21  Yes   cyproheptadine (PERIACTIN) 4 MG tablet Take 1 tablet (4 mg total) by mouth 3 (three) times daily as needed for allergies. Itching 03/06/22  Yes  Rodriguez-Southworth, Sunday Spillers, PA-C  dextromethorphan-guaiFENesin (MUCINEX DM) 30-600 MG 12hr tablet Take 1 tablet by mouth 2 (two) times daily.   Yes [provider]  ibuprofen (ADVIL) 200 MG tablet Take 400 mg by mouth every 6 (six) hours as needed for moderate pain.   Yes [provider]  ipratropium-albuterol (DUONEB) 0.5-2.5 (3) MG/3ML SOLN Inhale 3 mLs by nebulization 4 (four) times daily. 04/08/21  Yes   ipratropium-albuterol (DUONEB) 0.5-2.5 (3) MG/3ML SOLN USE 1 VIAL (3ML) VIA NEBULIZER 4 TIMES A DAY 02/11/22 02/11/23 Yes   methylPREDNISolone (MEDROL DOSEPAK) 4 MG TBPK tablet Take as directed per pack 03/06/22  Yes Rodriguez-Southworth, Sunday Spillers, PA-C   rosuvastatin (CRESTOR) 40 MG tablet Take 1 tablet (40 mg total) by mouth daily. 12/09/21  Yes Gollan, Kathlene November, MD  varenicline (CHANTIX CONTINUING MONTH PAK) 1 MG tablet Take 1 tablet (1 mg total) by mouth 2 (two) times daily. 12/09/21  Yes Minna Merritts, MD    Family History Family History  Problem Relation Age of Onset   Hypertension Mother    Hyperlipidemia Mother    Hypertension Father    Hyperlipidemia Father    Heart disease Father    Heart attack Father 34    Social History Social History   Tobacco Use   Smoking status: Every Day    Packs/day: 1.00    Years: 35.00    Total pack years: 35.00    Types: Cigarettes   Smokeless tobacco: Never  Vaping Use   Vaping Use: Some days  Substance Use Topics   Alcohol use: No   Drug use: No     Allergies   Morphine and related and Percocet [oxycodone-acetaminophen]   Review of Systems Review of Systems  Skin:  Positive for color change.       + swelling and itching     Physical Exam Triage Vital Signs ED Triage Vitals  Enc Vitals Group     BP 03/06/22 1325 (!) 143/85     Pulse Rate 03/06/22 1325 85     Resp 03/06/22 1325 18     Temp 03/06/22 1325 98 F (36.7 C)     Temp Source 03/06/22 1325 Oral     SpO2 03/06/22 1325 100 %     Weight 03/06/22 1322 188 lb (85.3 kg)     Height 03/06/22 1322 '5\' 3"'$  (1.6 m)     Head Circumference --      Peak Flow --      Pain Score 03/06/22 1322 0     Pain Loc --      Pain Edu? --      Excl. in [place]? --    No data found.  Updated Vital Signs BP (!) 143/85 (BP Location: Left Arm)   Pulse 85   Temp 98 F (36.7 C) (Oral)   Resp 18   Ht '5\' 3"'$  (1.6 m)   Wt 188 lb (85.3 kg)   SpO2 100%   BMI 33.30 kg/m   Visual Acuity Right Eye Distance:   Left Eye Distance:   Bilateral Distance:    Right Eye Near:   Left Eye Near:    Bilateral Near:     Physical Exam Vitals and nursing note reviewed.  Constitutional:      General: She is not in acute distress.     Appearance: She is obese.  HENT:     Right Ear: External ear normal.     Left Ear: External ear normal.  Eyes:  General: No scleral icterus.    Conjunctiva/sclera: Conjunctivae normal.  Pulmonary:     Effort: Pulmonary effort is normal.  Musculoskeletal:        General: Normal range of motion.     Cervical back: Neck supple.  Skin:    General: Skin is warm and dry.     Comments: L HAND- with moderate swelling on dorsal area and moving to distal forearm, the sting site is clean, area is mildly warm, but is not painful.  L FACE- with mild reaction over jaw on the L, no stinger seen.   Neurological:     Mental Status: She is alert and oriented to person, place, and time.     Gait: Gait normal.  Psychiatric:        Mood and Affect: Mood normal.        Behavior: Behavior normal.        Thought Content: Thought content normal.        Judgment: Judgment normal.      UC Treatments / Results  Labs (all labs ordered are listed, but only abnormal results are displayed) Labs Reviewed - No data to display  EKG   Radiology No results found.  Procedures Procedures (including critical care time)  Medications Ordered in UC Medications - No data to display  Initial Impression / Assessment and Plan / UC Course  I have reviewed the triage vital signs and the nursing notes.  Local reaction of L hand from yellow jacket.   I placed her on Medrol and Periactin as noted.      Final Clinical Impressions(s) / UC Diagnoses   Final diagnoses:  Yellow jacket sting allergy   Discharge Instructions   None    ED Prescriptions     Medication Sig Dispense Auth. Provider   methylPREDNISolone (MEDROL DOSEPAK) 4 MG TBPK tablet Take as directed per pack 1 each Rodriguez-Southworth, Sunday Spillers, PA-C   cyproheptadine (PERIACTIN) 4 MG tablet Take 1 tablet (4 mg total) by mouth 3 (three) times daily as needed for allergies. Itching 20 tablet Rodriguez-Southworth, Sunday Spillers, PA-C      PDMP  not reviewed this encounter.   Shelby Mattocks, PA-C 03/06/22 1345    Rodriguez-Southworth, [place], PA-C 03/06/22 1345

## 2022-03-06 NOTE — ED Triage Notes (Signed)
Pt c/o yellow jacket sting along face and left hand. X1day.  Pt states that this happened last night and the swelling has moved up the forearm.   Pt denies any pain, only itching.

## 2022-03-17 ENCOUNTER — Other Ambulatory Visit: Payer: Self-pay

## 2022-03-19 ENCOUNTER — Other Ambulatory Visit: Payer: Self-pay

## 2022-03-25 ENCOUNTER — Other Ambulatory Visit: Payer: Self-pay

## 2022-04-03 ENCOUNTER — Other Ambulatory Visit
Admission: RE | Admit: 2022-04-03 | Discharge: 2022-04-03 | Disposition: A | Discharge status: Home / Self Care | Payer: 59 | Attending: Internal Medicine | Admitting: Internal Medicine

## 2022-04-03 DIAGNOSIS — D72829 Elevated white blood cell count, unspecified: Secondary | ICD-10-CM | POA: Diagnosis not present

## 2022-04-03 LAB — CBC WITH DIFFERENTIAL/PLATELET
Abs Immature Granulocytes: 0.01 10*3/uL (ref 0.00–0.07)
Basophils Absolute: 0 10*3/uL (ref 0.0–0.1)
Basophils Relative: 0 %
Eosinophils Absolute: 0.3 10*3/uL (ref 0.0–0.5)
Eosinophils Relative: 5 %
HCT: 36.6 % (ref 36.0–46.0)
Hemoglobin: 12.3 g/dL (ref 12.0–15.0)
Immature Granulocytes: 0 %
Lymphocytes Relative: 34 %
Lymphs Abs: 2.3 10*3/uL (ref 0.7–4.0)
MCH: 30.3 pg (ref 26.0–34.0)
MCHC: 33.6 g/dL (ref 30.0–36.0)
MCV: 90.1 fL (ref 80.0–100.0)
Monocytes Absolute: 0.5 10*3/uL (ref 0.1–1.0)
Monocytes Relative: 7 %
Neutro Abs: 3.6 10*3/uL (ref 1.7–7.7)
Neutrophils Relative %: 54 %
Platelets: 287 10*3/uL (ref 150–400)
RBC: 4.06 MIL/uL (ref 3.87–5.11)
RDW: 12.9 % (ref 11.5–15.5)
WBC: 6.7 10*3/uL (ref 4.0–10.5)
nRBC: 0 % (ref 0.0–0.2)

## 2022-04-11 ENCOUNTER — Other Ambulatory Visit: Payer: Self-pay

## 2022-04-14 ENCOUNTER — Other Ambulatory Visit: Payer: Self-pay

## 2022-04-15 ENCOUNTER — Encounter: Payer: 59 | Admitting: Adult Health

## 2022-04-22 ENCOUNTER — Other Ambulatory Visit (HOSPITAL_COMMUNITY)
Admission: RE | Admit: 2022-04-22 | Discharge: 2022-04-22 | Disposition: A | Discharge status: Home / Self Care | Payer: 59 | Source: Ambulatory Visit | Attending: Adult Health | Admitting: Adult Health

## 2022-04-22 ENCOUNTER — Ambulatory Visit: Payer: 59 | Admitting: Adult Health

## 2022-04-22 ENCOUNTER — Other Ambulatory Visit: Payer: Self-pay | Admitting: Internal Medicine

## 2022-04-22 ENCOUNTER — Encounter: Payer: Self-pay | Admitting: Adult Health

## 2022-04-22 VITALS — BP 142/90 | HR 91 | Ht 61.5 in | Wt 191.5 lb

## 2022-04-22 DIAGNOSIS — Z01419 Encounter for gynecological examination (general) (routine) without abnormal findings: Secondary | ICD-10-CM | POA: Insufficient documentation

## 2022-04-22 DIAGNOSIS — Z1211 Encounter for screening for malignant neoplasm of colon: Secondary | ICD-10-CM | POA: Diagnosis not present

## 2022-04-22 DIAGNOSIS — Z1231 Encounter for screening mammogram for malignant neoplasm of breast: Secondary | ICD-10-CM

## 2022-04-22 DIAGNOSIS — Z124 Encounter for screening for malignant neoplasm of cervix: Secondary | ICD-10-CM | POA: Diagnosis not present

## 2022-04-22 LAB — HEMOCCULT GUIAC POC 1CARD (OFFICE): Fecal Occult Blood, POC: NEGATIVE

## 2022-04-22 NOTE — Progress Notes (Signed)
Patient ID: Laura Gillespie, female   DOB: 07/04/1961, 61 y.o.   MRN: [id number] History of Present Illness: Laura Gillespie is a 61 year old white female, widowed, PM in for a pap and GYN exam. She had physical with PCP in June. No pap or pelvic exam in about 13 years she says. She is still working as Quarry manager.her mom 40 lives with her.  PCP is Laura Laura Gillespie.   Current Medications, Allergies, Past Medical History, Past Surgical History, Family History and Social History were reviewed in [place] record.     Review of Systems: Patient denies any headaches, hearing loss, fatigue, blurred vision, shortness of breath, chest pain, abdominal pain, problems with bowel movements, urination, or intercourse(not active). No joint pain or mood swings.  Does have sciatic pain and is going to see neurosurgeon.    Physical Exam:BP (!) 142/90 (BP Location: Right Arm, Patient Position: Sitting, Cuff Size: Normal)   Pulse 91   Ht 5' 1.5" (1.562 m)   Wt 191 lb 8 oz (86.9 kg)   BMI 35.60 kg/m   General:  Well developed, well nourished, no acute distress Skin:  Warm and dry Lungs; Clear to auscultation bilaterally Breast:  No dominant palpable mass, retraction, or nipple discharge Cardiovascular: Regular rate and rhythm Pelvic:  External genitalia is normal in appearance, no lesions.  The vagina is normal in appearance. Urethra has no lesions or masses. The cervix is smooth, no lesion, pap with HR HPV genotyping performed by Laura Gillespie.Cervix slightly friable to touch.  Uterus is felt to be normal size, shape, and contour.  No adnexal masses or tenderness noted.Bladder is non tender, no masses felt. Rectal: Good sphincter tone, no polyps, or hemorrhoids felt.  Hemoccult negative. Extremities/musculoskeletal:  No swelling or varicosities noted, no clubbing or cyanosis Psych:  No mood changes, alert and cooperative,seems happy AA is 5 Fall risk is low    04/22/2022    9:40 AM  Depression  screen PHQ 2/9  Decreased Interest 1  Down, Depressed, Hopeless 1  PHQ - 2 Score 2  Altered sleeping 1  Tired, decreased energy 2  Change in appetite 0  Feeling bad or failure about yourself  0  Trouble concentrating 0  Moving slowly or fidgety/restless 0  Suicidal thoughts 0  PHQ-9 Score 5       04/22/2022    9:41 AM  GAD 7 : Generalized Anxiety Score  Nervous, Anxious, on Edge 0  Control/stop worrying 0  Worry too much - different things 1  Trouble relaxing 1  Restless 2  Easily annoyed or irritable 1  Afraid - awful might happen 0  Total GAD 7 Score 5    I chaperoned Laura Gillespie for pelvic exam.  Impression and Plan: 1. Encounter for gynecological examination with Papanicolaou smear of cervix Pap sent Pap in 3 years if normal. Physical with PCP Labs with PCP She says getting Mammogram in [place] Colonoscopy per GI about 2031 Encouraged to stop smoking, she has stopped and restarted, gets low dose CT with PCP She is active, lives [place] farm in [place].  2. Encounter for screening fecal occult blood testing Hemoccult is negative

## 2022-04-25 LAB — CYTOLOGY - PAP
Comment: NEGATIVE
Diagnosis: NEGATIVE
High risk HPV: NEGATIVE

## 2022-05-11 ENCOUNTER — Other Ambulatory Visit: Payer: Self-pay

## 2022-05-12 ENCOUNTER — Other Ambulatory Visit: Payer: Self-pay

## 2022-05-15 ENCOUNTER — Ambulatory Visit
Admission: RE | Admit: 2022-05-15 | Discharge: 2022-05-15 | Disposition: A | Discharge status: Home / Self Care | Payer: 59 | Source: Ambulatory Visit | Attending: Internal Medicine | Admitting: Internal Medicine

## 2022-05-15 DIAGNOSIS — Z1231 Encounter for screening mammogram for malignant neoplasm of breast: Secondary | ICD-10-CM | POA: Insufficient documentation

## 2022-07-04 ENCOUNTER — Other Ambulatory Visit: Payer: Self-pay

## 2022-09-10 ENCOUNTER — Encounter: Payer: Self-pay | Admitting: Emergency Medicine

## 2022-09-10 ENCOUNTER — Emergency Department: Payer: 59

## 2022-09-10 DIAGNOSIS — R002 Palpitations: Secondary | ICD-10-CM | POA: Diagnosis not present

## 2022-09-10 DIAGNOSIS — R0789 Other chest pain: Secondary | ICD-10-CM | POA: Diagnosis not present

## 2022-09-10 DIAGNOSIS — R072 Precordial pain: Secondary | ICD-10-CM | POA: Diagnosis not present

## 2022-09-10 DIAGNOSIS — F17200 Nicotine dependence, unspecified, uncomplicated: Secondary | ICD-10-CM | POA: Diagnosis not present

## 2022-09-10 DIAGNOSIS — I2510 Atherosclerotic heart disease of native coronary artery without angina pectoris: Secondary | ICD-10-CM | POA: Insufficient documentation

## 2022-09-10 DIAGNOSIS — R42 Dizziness and giddiness: Secondary | ICD-10-CM | POA: Diagnosis not present

## 2022-09-10 DIAGNOSIS — R1013 Epigastric pain: Secondary | ICD-10-CM | POA: Insufficient documentation

## 2022-09-10 DIAGNOSIS — S2231XA Fracture of one rib, right side, initial encounter for closed fracture: Secondary | ICD-10-CM | POA: Diagnosis not present

## 2022-09-10 LAB — CBC
HCT: 37.8 % (ref 36.0–46.0)
Hemoglobin: 12.7 g/dL (ref 12.0–15.0)
MCH: 30.2 pg (ref 26.0–34.0)
MCHC: 33.6 g/dL (ref 30.0–36.0)
MCV: 89.8 fL (ref 80.0–100.0)
Platelets: 290 10*3/uL (ref 150–400)
RBC: 4.21 MIL/uL (ref 3.87–5.11)
RDW: 12.8 % (ref 11.5–15.5)
WBC: 7.1 10*3/uL (ref 4.0–10.5)
nRBC: 0 % (ref 0.0–0.2)

## 2022-09-10 LAB — BASIC METABOLIC PANEL
Anion gap: 9 (ref 5–15)
BUN: 12 mg/dL (ref 6–20)
CO2: 25 mmol/L (ref 22–32)
Calcium: 9.2 mg/dL (ref 8.9–10.3)
Chloride: 103 mmol/L (ref 98–111)
Creatinine, Ser: 0.67 mg/dL (ref 0.44–1.00)
GFR, Estimated: >60 mL/min (ref >60)
Glucose, Bld: 119 mg/dL — ABNORMAL HIGH (ref 70–99)
Potassium: 4 mmol/L (ref 3.5–5.1)
Sodium: 137 mmol/L (ref 135–145)

## 2022-09-10 LAB — TROPONIN I (HIGH SENSITIVITY): Troponin I (High Sensitivity): 5 ng/L (ref <18)

## 2022-09-10 NOTE — ED Triage Notes (Signed)
Pt presents via POV with complaints of dizziness for the last 3 days with associated palpitations. Endorses mid-sternal CP that started tonight. Pt took hydrocodone ('20mg'$ ) or back pain & 1 nitro PTA which helped resolve her CP & palpitation. Hx of MI with stent placement in 2015. Denies N/V/D or SOB.

## 2022-09-11 ENCOUNTER — Emergency Department
Admission: EM | Admit: 2022-09-11 | Discharge: 2022-09-11 | Disposition: A | Discharge status: Home / Self Care | Payer: 59 | Attending: Emergency Medicine | Admitting: Emergency Medicine

## 2022-09-11 DIAGNOSIS — R0789 Other chest pain: Secondary | ICD-10-CM

## 2022-09-11 LAB — TROPONIN I (HIGH SENSITIVITY): Troponin I (High Sensitivity): 4 ng/L (ref <18)

## 2022-09-11 NOTE — ED Provider Notes (Signed)
[facility] Provider Note    Event Date/Time   First MD Initiated Contact with Patient 09/11/22 [phone]     (approximate)   History   Chest Pain   HPI  Laura Gillespie is a 62 y.o. female who presents to the ED for evaluation of Chest Pain   I review a cardiology clinic visit from April of last year.  Long history of smoking and with known CAD, had a bare-metal stent placed to the RCA in 2015.  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  She reports 2 to 3 days of intermittent palpitations and presyncopal dizziness without any falls, syncope or chest pain until this evening.  This evening around 8 PM she was getting dinner ready, she reports a brief episode of epigastric and lower chest discomfort, after taking an old hydrocodone tablet due to her chronic pain of her back and leg.  Reports pain lasted a matter of a few minutes, resolved with a dose of nitroglycerin.  No recurrence of the pain.  Asymptomatic now.  Continues to smoke.  No increased cough or increased sputum production.  No dyspnea  Physical Exam   Triage Vital Signs: ED Triage Vitals  Enc Vitals Group     BP 09/10/22 2225 (!) 146/82     Pulse Rate 09/10/22 2225 96     Resp 09/10/22 2225 18     Temp 09/10/22 2225 98.4 F (36.9 C)     Temp Source 09/10/22 2225 Oral     SpO2 09/10/22 2225 100 %     Weight 09/10/22 2217 196 lb (88.9 kg)     Height 09/10/22 2217 5' 1.5" (1.562 m)     Head Circumference --      Peak Flow --      Pain Score --      Pain Loc --      Pain Edu? --      Excl. in [place]? --     Most recent vital signs: Vitals:   09/10/22 2225 09/11/22 0103  BP: (!) 146/82 (!) 145/87  Pulse: 96 83  Resp: 18 16  Temp: 98.4 F (36.9 C) 98.1 F (36.7 C)  SpO2: 100% 100%    General: Awake, no distress.  CV:  Good peripheral perfusion.  RRR Resp:  Normal effort. CTAB Abd:  No distention. Soft and benign MSK:  No deformity noted.  Neuro:  No focal deficits appreciated. Other:     ED Results / Procedures / Treatments   Labs (all labs ordered are listed, but only abnormal results are displayed) Labs Reviewed  BASIC METABOLIC PANEL - Abnormal; Notable for the following components:      Result Value   Glucose, Bld 119 (*)    All other components within normal limits  CBC  TROPONIN I (HIGH SENSITIVITY)  TROPONIN I (HIGH SENSITIVITY)    EKG Sinus rhythm at a rate of 98 bpm.  Normal axis.  Right bundle and left anterior fascicular blocks.  No clear acute ischemic features.  RADIOLOGY CXR interpreted by me without evidence of acute cardiopulmonary pathology.  Official radiology report(s): DG Chest 2 View  Result Date: 09/10/2022 CLINICAL DATA:  Chest pain. Dizziness and palpitations for 3 days. Midsternal chest pain. EXAM: CHEST - 2 VIEW COMPARISON:  08/11/2016 FINDINGS: Heart size and pulmonary vascularity are normal. Coarse markings demonstrated in  the right lung base, similar to prior study, likely fibrosis. Calcified granuloma in the right mid lung. No airspace disease or consolidation. No pleural effusions. No pneumothorax. Mediastinal contours appear intact. Degenerative changes in the shoulders. Surgical clips in the upper abdomen. Old right rib fractures. IMPRESSION: Scarring in the right lung base. No evidence of active pulmonary disease. Electronically Signed   By: Lucienne Capers M.D.   On: 09/10/2022 22:37    PROCEDURES and INTERVENTIONS:  .1-3 Lead EKG Interpretation  Performed by: Vladimir Crofts, MD Authorized by: Vladimir Crofts, MD     Interpretation: normal     ECG rate:  80   ECG rate assessment: normal     Rhythm: sinus rhythm     Ectopy: none     Conduction: normal     Medications - No data to display   IMPRESSION / MDM / [place] / ED COURSE  I reviewed the triage vital signs and  the nursing notes.  Differential diagnosis includes, but is not limited to, ACS, PTX, PNA, muscle strain/spasm, PE, dissection, cardiac dysrhythmia  {Patient presents with symptoms of an acute illness or injury that is potentially life-threatening.  62 year old woman with known CAD presents after an episode of chest pain,  without evidence of acute pathology and suitable for outpatient management.  Looks well and remains asymptomatic throughout her ED stay.  Nonischemic EKG and 2 negative troponins.  Normal metabolic panel and CBC.  Clear CXR.  No dysrhythmias on monitor.  I considered observation admission for this patient, but ultimately I think outpatient management with close follow-up is reasonable.  We discussed return precautions.  She had initially told me that she was having some urinary frequency for the past couple weeks that she thought was probably just because she was drinking too much water.  She initially is agreeable for urinalysis, but after I tell her the second troponin is negative, she declines this is diagnostic study acknowledging the unlikely possibility of acute cystitis.  Clinical Course as of 09/11/22 1517  Thu Sep 11, 2022  0207 Reassessed.  Remains pain-free.  No dysrhythmias on the monitor.  We discussed reassuring workup.  We again discussed urinalysis and possible cystitis.  She declines wanting to wait for this and is ready to go home. [DS]    Clinical Course User Index [DS] Vladimir Crofts, MD     FINAL CLINICAL IMPRESSION(S) / ED DIAGNOSES   Final diagnoses:  Other chest pain     Rx / DC Orders   ED Discharge Orders     None        Note:  This document was prepared using Dragon voice recognition software and may include unintentional dictation errors.   Vladimir Crofts, MD 09/11/22 [phone]

## 2022-09-11 NOTE — ED Notes (Signed)
Pt Dc to home. Dc instructions reviewed with all questions answered. Pt voices understanding. IV removed, cath intact, pressure dressing applied. No bleeding noted at site. Pt ambulatory out of dept with steady gait with support person

## 2022-09-12 ENCOUNTER — Other Ambulatory Visit: Payer: Self-pay

## 2022-09-12 MED ORDER — ALBUTEROL SULFATE HFA 108 (90 BASE) MCG/ACT IN AERS
1.0000 | INHALATION_SPRAY | RESPIRATORY_TRACT | 1 refills | Status: DC | PRN
  Filled 2022-09-12: qty 20.1, 75d supply, fill #0
  Filled 2023-01-13: qty 20.1, 75d supply, fill #1

## 2022-09-26 DIAGNOSIS — M5416 Radiculopathy, lumbar region: Secondary | ICD-10-CM | POA: Diagnosis not present

## 2022-09-26 DIAGNOSIS — Z6834 Body mass index (BMI) 34.0-34.9, adult: Secondary | ICD-10-CM | POA: Diagnosis not present

## 2022-09-29 ENCOUNTER — Other Ambulatory Visit: Payer: Self-pay

## 2022-10-02 ENCOUNTER — Other Ambulatory Visit: Payer: Self-pay | Admitting: Neurological Surgery

## 2022-10-02 DIAGNOSIS — M5416 Radiculopathy, lumbar region: Secondary | ICD-10-CM

## 2022-10-09 ENCOUNTER — Ambulatory Visit
Admission: RE | Admit: 2022-10-09 | Discharge: 2022-10-09 | Disposition: A | Discharge status: Home / Self Care | Payer: 59 | Source: Ambulatory Visit | Attending: Neurological Surgery | Admitting: Neurological Surgery

## 2022-10-09 DIAGNOSIS — M5416 Radiculopathy, lumbar region: Secondary | ICD-10-CM | POA: Diagnosis not present

## 2022-10-09 DIAGNOSIS — M47816 Spondylosis without myelopathy or radiculopathy, lumbar region: Secondary | ICD-10-CM | POA: Diagnosis not present

## 2022-10-09 DIAGNOSIS — M545 Low back pain, unspecified: Secondary | ICD-10-CM | POA: Diagnosis not present

## 2022-10-09 DIAGNOSIS — M4316 Spondylolisthesis, lumbar region: Secondary | ICD-10-CM | POA: Diagnosis not present

## 2022-10-17 DIAGNOSIS — M48061 Spinal stenosis, lumbar region without neurogenic claudication: Secondary | ICD-10-CM | POA: Diagnosis not present

## 2022-10-17 DIAGNOSIS — M5416 Radiculopathy, lumbar region: Secondary | ICD-10-CM | POA: Diagnosis not present

## 2022-10-18 ENCOUNTER — Encounter: Payer: Self-pay | Admitting: Emergency Medicine

## 2022-10-18 ENCOUNTER — Ambulatory Visit
Admission: EM | Admit: 2022-10-18 | Discharge: 2022-10-18 | Disposition: A | Discharge status: Home / Self Care | Payer: 59 | Attending: Emergency Medicine | Admitting: Emergency Medicine

## 2022-10-18 ENCOUNTER — Ambulatory Visit (INDEPENDENT_AMBULATORY_CARE_PROVIDER_SITE_OTHER): Payer: 59

## 2022-10-18 DIAGNOSIS — I2510 Atherosclerotic heart disease of native coronary artery without angina pectoris: Secondary | ICD-10-CM | POA: Diagnosis not present

## 2022-10-18 DIAGNOSIS — R0602 Shortness of breath: Secondary | ICD-10-CM | POA: Diagnosis not present

## 2022-10-18 DIAGNOSIS — J40 Bronchitis, not specified as acute or chronic: Secondary | ICD-10-CM

## 2022-10-18 DIAGNOSIS — Z1152 Encounter for screening for COVID-19: Secondary | ICD-10-CM | POA: Diagnosis not present

## 2022-10-18 DIAGNOSIS — E785 Hyperlipidemia, unspecified: Secondary | ICD-10-CM | POA: Insufficient documentation

## 2022-10-18 DIAGNOSIS — Z955 Presence of coronary angioplasty implant and graft: Secondary | ICD-10-CM | POA: Insufficient documentation

## 2022-10-18 DIAGNOSIS — I10 Essential (primary) hypertension: Secondary | ICD-10-CM | POA: Insufficient documentation

## 2022-10-18 DIAGNOSIS — J449 Chronic obstructive pulmonary disease, unspecified: Secondary | ICD-10-CM | POA: Diagnosis not present

## 2022-10-18 DIAGNOSIS — R059 Cough, unspecified: Secondary | ICD-10-CM

## 2022-10-18 DIAGNOSIS — I252 Old myocardial infarction: Secondary | ICD-10-CM | POA: Insufficient documentation

## 2022-10-18 LAB — SARS CORONAVIRUS 2 BY RT PCR: SARS Coronavirus 2 by RT PCR: NEGATIVE

## 2022-10-18 MED ORDER — PROMETHAZINE-DM 6.25-15 MG/5ML PO SYRP: 5.0000 mL | ORAL_SOLUTION | Freq: Four times a day (QID) | ORAL | 0 refills | Status: AC | PRN

## 2022-10-18 MED ORDER — DOXYCYCLINE HYCLATE 100 MG PO CAPS: 100.0000 mg | ORAL_CAPSULE | Freq: Two times a day (BID) | ORAL | 0 refills | Status: AC

## 2022-10-18 MED ORDER — DEXAMETHASONE SODIUM PHOSPHATE 10 MG/ML IJ SOLN
10.0000 mg | Freq: Once | INTRAMUSCULAR | Status: AC
  Administered 2022-10-18: 10 mg via INTRAMUSCULAR

## 2022-10-18 MED ORDER — BENZONATATE 100 MG PO CAPS: 200.0000 mg | ORAL_CAPSULE | Freq: Three times a day (TID) | ORAL | 0 refills | Status: DC

## 2022-10-18 NOTE — ED Triage Notes (Signed)
Pt presents with a cough, SOB, chest congestion x 2 weeks.

## 2022-10-18 NOTE — Discharge Instructions (Addendum)
Use the albuterol inhaler/nebulizer every 4-6 hours as needed for shortness of breath, wheezing, and cough.  Take the doxycycline twice daily for 7 days for treatment of bronchitis.  Use the Tessalon Perles every 8 hours for your cough.  Taken with a small sip of water.  They may give you some numbness to the base of your tongue or metallic taste in her mouth, this is normal.  They are designed to calm down the cough reflex.  Use the Promethazine DM cough syrup at bedtime as will make you drowsy.  You may take 1 teaspoon (5 mL) every 6 hours.  Return for reevaluation for new or worsening symptoms.

## 2022-10-18 NOTE — ED Provider Notes (Signed)
[facility]    CSN: [id number] Arrival date & time: 10/18/22  0801      History   Chief Complaint No chief complaint on file.   HPI Laura Gillespie is a 62 y.o. female.   HPI  62 year old female here for evaluation respiratory complaints.  The patient has a significant past medical history to include hypertension, NSTEMI, right coronary artery stent with coronary atherosclerosis, hyperlipidemia, bronchitis, and nicotine dependence who presents for 2 weeks of cough.  She reports that her cough was initially productive for green sputum but now it is dry.  In the last 2 days she has developed shortness of breath, runny nose, nasal congestion.  She also complains that her chest feels tight.  She thinks that she has bronchitis.  She has an elevated temp in clinic of 99.1 but she has not measured a fever at home and she is denying sore throat or GI symptoms.  She did take a home COVID test that was negative.  Past Medical History:  Diagnosis Date   Acute non-ST segment elevation myocardial infarction [facility])    Allergic rhinitis due to pollen    Anxiety disorder, unspecified    Atherosclerotic heart disease of native coronary artery without angina pectoris    Back pain    Bronchitis, not specified as acute or chronic    Chronic obstructive pulmonary disease with (acute) exacerbation (HCC)    Coronary artery disease 12/2013   s/p bare metal stent to distal RCA   Essential (primary) hypertension    Hyperlipidemia    Hyperlipidemia, unspecified    Hypertension    Nicotine dependence, unspecified, uncomplicated    Other hypersomnia     Patient Active Problem List   Diagnosis Date Noted   Encounter for screening fecal occult blood testing 04/22/2022   Encounter for gynecological examination with Papanicolaou smear of cervix 04/22/2022   Morbid obesity ([place]) 10/29/2015   Myalgia 07/17/2014   SOB (shortness of breath) 07/17/2014   S/P right coronary artery (RCA) stent  placement 01/17/2014   Coronary atherosclerosis of native coronary artery 01/17/2014   NSTEMI (non-ST elevated myocardial infarction) ([place]) 01/17/2014   Hyperlipidemia 01/17/2014   Smoker 01/17/2014   Upper respiratory infection 01/17/2014    Past Surgical History:  Procedure Laterality Date   CARDIAC CATHETERIZATION     CARPAL TUNNEL RELEASE     both hand   CHOLECYSTECTOMY     COLONOSCOPY N/A 05/24/2020   Procedure: COLONOSCOPY;  Surgeon: Rogene Houston, MD;  Location: [facility];  Service: Endoscopy;  Laterality: N/A;  1030   CORONARY ANGIOPLASTY  12/2013   stent placement to the distal RCA   LOBECTOMY Right 1991   LUNG BIOPSY     TONSILLECTOMY     TUBAL LIGATION      OB History     Gravida  3   Para  2   Term  2   Preterm      AB  1   Living  2      SAB  1   IAB      Ectopic      Multiple      Live Births  2            Home Medications    Prior to Admission medications   Medication Sig Start Date End Date Taking? Authorizing Provider  acetaminophen (TYLENOL) 500 MG tablet Take 1,000 mg by mouth every 6 (six) hours as needed for moderate pain.  [provider]  albuterol (VENTOLIN HFA) 108 (90 Base) MCG/ACT inhaler Inhale 1-2 puffs into the lungs every 4 (four) hours as needed. 02/11/22     amitriptyline (ELAVIL) 25 MG tablet Take one tablet by mouth at bedtime for 7 nights; then take 2 tablets by mouth at bedtime thereafter as needed. 01/23/22     amLODipine (NORVASC) 10 MG tablet Take 1 tablet (10 mg total) by mouth daily. 12/26/21     dextromethorphan-guaiFENesin (MUCINEX DM) 30-600 MG 12hr tablet Take 1 tablet by mouth 2 (two) times daily.    [provider]  ibuprofen (ADVIL) 200 MG tablet Take 400 mg by mouth every 6 (six) hours as needed for moderate pain.    [provider]  ibuprofen (ADVIL) 200 MG tablet Take 200 mg by mouth every 6 (six) hours as needed.    [provider]  ipratropium-albuterol  (DUONEB) 0.5-2.5 (3) MG/3ML SOLN USE 1 VIAL (3ML) VIA NEBULIZER 4 TIMES A DAY 02/11/22 02/11/23    Naproxen (NAPROSYN PO) Take by mouth.    [provider]  rosuvastatin (CRESTOR) 40 MG tablet Take 1 tablet (40 mg total) by mouth daily. 12/09/21   Minna Merritts, MD    Family History Family History  Problem Relation Age of Onset   Heart attack Paternal Grandfather    Dementia Paternal Grandmother    Other Maternal Grandmother        brain tumor   Heart attack Maternal Grandfather    Cancer Father        lung   Hypertension Father    Hyperlipidemia Father    Heart disease Father    Heart attack Father 50   Lymphoma Father    Hypertension Mother    Hyperlipidemia Mother    Atrial fibrillation Mother    Hypertension Son     Social History Social History   Tobacco Use   Smoking status: Every Day    Packs/day: 0.50    Years: 35.00    Total pack years: 17.50    Types: Cigarettes   Smokeless tobacco: Never  Vaping Use   Vaping Use: Some days  Substance Use Topics   Alcohol use: Yes   Drug use: No     Allergies   Morphine and related and Percocet [oxycodone-acetaminophen]   Review of Systems Review of Systems  Constitutional:  Negative for fever.  HENT:  Positive for congestion and rhinorrhea. Negative for sore throat.   Respiratory:  Positive for cough, shortness of breath and wheezing.   Gastrointestinal:  Negative for diarrhea, nausea and vomiting.  Hematological: Negative.   Psychiatric/Behavioral: Negative.       Physical Exam Triage Vital Signs ED Triage Vitals  Enc Vitals Group     BP      Pulse      Resp      Temp      Temp src      SpO2      Weight      Height      Head Circumference      Peak Flow      Pain Score      Pain Loc      Pain Edu?      Excl. in [place]?    No data found.  Updated Vital Signs There were no vitals taken for this visit.  Visual Acuity Right Eye Distance:   Left Eye Distance:   Bilateral Distance:     Right Eye Near:  Left Eye Near:    Bilateral Near:     Physical Exam Vitals and nursing note reviewed.  Constitutional:      Appearance: Normal appearance. She is not ill-appearing.  HENT:     Head: Normocephalic and atraumatic.     Right Ear: Tympanic membrane, ear canal and external ear normal. There is no impacted cerumen.     Left Ear: Tympanic membrane, ear canal and external ear normal. There is no impacted cerumen.     Nose: Congestion and rhinorrhea present.     Comments: Nose is erythematous and edematous with clear discharge in both nares.    Mouth/Throat:     Mouth: Mucous membranes are moist.     Pharynx: Oropharynx is clear. Posterior oropharyngeal erythema present. No oropharyngeal exudate.     Comments: Patient has erythema in the posterior oropharynx with clear postnasal drip. Cardiovascular:     Rate and Rhythm: Normal rate and regular rhythm.     Pulses: Normal pulses.     Heart sounds: Normal heart sounds. No murmur heard.    No friction rub. No gallop.  Pulmonary:     Effort: Pulmonary effort is normal.     Breath sounds: Wheezing and rhonchi present. No rales.  Musculoskeletal:     Cervical back: Normal range of motion and neck supple.  Lymphadenopathy:     Cervical: No cervical adenopathy.  Skin:    General: Skin is warm and dry.     Capillary Refill: Capillary refill takes less than 2 seconds.  Neurological:     General: No focal deficit present.     Mental Status: She is alert and oriented to person, place, and time.      UC Treatments / Results  Labs (all labs ordered are listed, but only abnormal results are displayed) Labs Reviewed - No data to display  EKG   Radiology No results found.  Procedures Procedures (including critical care time)  Medications Ordered in UC Medications - No data to display  Initial Impression / Assessment and Plan / UC Course  I have reviewed the triage vital signs and the nursing notes.  Pertinent  labs & imaging results that were available during my care of the patient were reviewed by me and considered in my medical decision making (see chart for details).   Patient is a pleasant and nontoxic-appearing 62 year old female who is a smoker and has a history of bronchitis, in addition to a significant cardiac history, presenting for evaluation of 2 weeks of cough with more acute onset of shortness of breath and wheezing with runny nose and nasal congestion in the past 2 days.  She does have edematous nasal mucosa with clear rhinorrhea and erythematous posterior oropharynx with clear postnasal drip.  Her lung sounds reflect wheezes and rhonchi diffusely giving a bronchitis picture.  She has a mildly elevated temp here in clinic of 99.1 but has not measured a fever at home.  She is mildly tachycardic at 104 but her respiratory rate is 18 and her room air oxygen saturation is 96%.  She is able to speak in full sentence without dyspnea or tachypnea.  Given the acute onset of her upper respiratory symptoms I will order a COVID PCR.  Since that been going on for 2 days and she has not been febrile at home I would not order an influenza test at this time.  I will order chest x-ray to look for any acute cardiopulmonary pathology.  Radiology impression of chest x-ray  states the scars are again seen in the right lung base and the left lung is clear.  Postoperative changes of resection the majority of the right fifth rib but no acute disease.  COVID PCR is negative.  I will discharge patient home with a diagnosis of bronchitis and I will administer a shot of Decadron in clinic to help her with her breathing.  She has an albuterol inhaler prescribed to her that I will have her do continuing to use every 4-6 hours with a spacer.  I will also prescribe Tessalon Perles and Promethazine DM cough syrup to help with cough and congestion.  I will also prescribe doxycycline 100 mg twice a day for 7 days to cover for  potential bacterial sources given that her cough has been progressing for the past 2 weeks.   Final Clinical Impressions(s) / UC Diagnoses   Final diagnoses:  None   Discharge Instructions   None    ED Prescriptions   None    PDMP not reviewed this encounter.   Margarette Canada, NP 10/18/22 [phone]

## 2022-11-11 ENCOUNTER — Ambulatory Visit
Payer: 59 | Attending: Student in an Organized Health Care Education/Training Program | Admitting: Student in an Organized Health Care Education/Training Program

## 2022-11-11 ENCOUNTER — Encounter: Payer: Self-pay | Admitting: Student in an Organized Health Care Education/Training Program

## 2022-11-11 VITALS — BP 138/48 | HR 86 | Temp 97.9°F | Resp 16 | Ht 63.0 in | Wt 196.0 lb

## 2022-11-11 DIAGNOSIS — G8929 Other chronic pain: Secondary | ICD-10-CM | POA: Diagnosis not present

## 2022-11-11 DIAGNOSIS — M5416 Radiculopathy, lumbar region: Secondary | ICD-10-CM | POA: Diagnosis not present

## 2022-11-11 DIAGNOSIS — M48061 Spinal stenosis, lumbar region without neurogenic claudication: Secondary | ICD-10-CM | POA: Insufficient documentation

## 2022-11-11 NOTE — Progress Notes (Signed)
Safety precautions to be maintained throughout the outpatient stay will include: orient to surroundings, keep bed in low position, maintain call bell within reach at all times, provide assistance with transfer out of bed and ambulation.  

## 2022-11-11 NOTE — Progress Notes (Signed)
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 Diagnoses of Lumbar radiculopathy and Chronic radicular lumbar pain were also pertinent to this visit.  Visit Diagnosis (New problems to examiner): 1. 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 Lumbar radiculopathy   3. Chronic radicular lumbar pain    Plan of Care (Initial workup plan)  Continue with home exercise program, multimodal analgesics.  Discussed left lumbar transforaminal injection for lumbar radicular pain.  Risks and benefits reviewed and patient would like to  proceed.  Procedure Orders         Lumbar Transforaminal Epidural    Provider-requested follow-up: Return in about 1 week (around 11/18/2022) for left L2/3 and L3/4 ESI, in clinic [facility].  Future Appointments  Date Time Provider [place]  11/19/2022  2:00 PM Gillis Santa, MD [facility] None    Duration of encounter: 73minutes.  Total time on encounter, as per AMA guidelines included both the face-to-face and non-face-to-face time personally spent by the physician and/or other qualified health care professional(s) on the day of the encounter (includes time in activities that require the physician or other qualified health care professional and does not include time in activities normally performed by clinical staff). Physician's time may include the following activities  when performed: Preparing to see the patient (e.g., pre-charting review of records, searching for previously ordered imaging, lab work, and nerve conduction tests) Review of prior analgesic pharmacotherapies. Reviewing PMP Interpreting ordered tests (e.g., lab work, imaging, nerve conduction tests) Performing post-procedure evaluations, including interpretation of diagnostic procedures Obtaining and/or reviewing separately obtained history Performing a medically appropriate examination and/or evaluation Counseling and educating the patient/family/caregiver Ordering medications, tests, or procedures Referring and communicating with other health care professionals (when not separately reported) Documenting clinical information in the electronic or other health record Independently interpreting results (not separately reported) and communicating results to the patient/ family/caregiver Care coordination (not separately reported)  Note by: Gillis Santa, MD (TTS technology used. I apologize for any typographical errors that were not detected and corrected.) Date: 11/11/2022; Time: 10:07 AM

## 2022-11-11 NOTE — Patient Instructions (Signed)
____________________________________________________________________________________________  General Risks and Possible Complications  Patient Responsibilities: It is important that you read this as it is part of your informed consent. It is our duty to inform you of the risks and possible complications associated with treatments offered to you. It is your responsibility as a patient to read this and to ask questions about anything that is not clear or that you believe was not covered in this document.  Patient's Rights: You have the right to refuse treatment. You also have the right to change your mind, even after initially having agreed to have the treatment done. However, under this last option, if you wait until the last second to change your mind, you may be charged for the materials used up to that point.  Introduction: Medicine is not an exact science. Everything in Medicine, including the lack of treatment(s), carries the potential for danger, harm, or loss (which is by definition: Risk). In Medicine, a complication is a secondary problem, condition, or disease that can aggravate an already existing one. All treatments carry the risk of possible complications. The fact that a side effects or complications occurs, does not imply that the treatment was conducted incorrectly. It must be clearly understood that these can happen even when everything is done following the highest safety standards.  No treatment: You can choose not to proceed with the proposed treatment alternative. The "PRO(s)" would include: avoiding the risk of complications associated with the therapy. The "CON(s)" would include: not getting any of the treatment benefits. These benefits fall under one of three categories: diagnostic; therapeutic; and/or palliative. Diagnostic benefits include: getting information which can ultimately lead to improvement of the disease or symptom(s). Therapeutic benefits are those associated with  the successful treatment of the disease. Finally, palliative benefits are those related to the decrease of the primary symptoms, without necessarily curing the condition (example: decreasing the pain from a flare-up of a chronic condition, such as incurable terminal cancer).  General Risks and Complications: These are associated to most interventional treatments. They can occur alone, or in combination. They fall under one of the following six (6) categories: no benefit or worsening of symptoms; bleeding; infection; nerve damage; allergic reactions; and/or death. No benefits or worsening of symptoms: In Medicine there are no guarantees, only probabilities. No healthcare provider can ever guarantee that a medical treatment will work, they can only state the probability that it may. Furthermore, there is always the possibility that the condition may worsen, either directly, or indirectly, as a consequence of the treatment. Bleeding: This is more common if the patient is taking a blood thinner, either prescription or over the counter (example: Goody Powders, Fish oil, Aspirin, Garlic, etc.), or if suffering a condition associated with impaired coagulation (example: Hemophilia, cirrhosis of the liver, low platelet counts, etc.). However, even if you do not have one on these, it can still happen. If you have any of these conditions, or take one of these drugs, make sure to notify your treating physician. Infection: This is more common in patients with a compromised immune system, either due to disease (example: diabetes, cancer, human immunodeficiency virus [HIV], etc.), or due to medications or treatments (example: therapies used to treat cancer and rheumatological diseases). However, even if you do not have one on these, it can still happen. If you have any of these conditions, or take one of these drugs, make sure to notify your treating physician. Nerve Damage: This is more common when the treatment is an    invasive one, but it can also happen with the use of medications, such as those used in the treatment of cancer. The damage can occur to small secondary nerves, or to large primary ones, such as those in the spinal cord and brain. This damage may be temporary or permanent and it may lead to impairments that can range from temporary numbness to permanent paralysis and/or brain death. Allergic Reactions: Any time a substance or material comes in contact with our body, there is the possibility of an allergic reaction. These can range from a mild skin rash (contact dermatitis) to a severe systemic reaction (anaphylactic reaction), which can result in death. Death: In general, any medical intervention can result in death, most of the time due to an unforeseen complication. ____________________________________________________________________________________________    ______________________________________________________________________  Preparing for your procedure  Appointments: If you think you may not be able to keep your appointment, call 24-48 hours in advance to cancel. We need time to make it available to others.  During your procedure appointment there will be: No Prescription Refills. No disability issues to discussed. No medication changes or discussions.  Instructions: Food intake: Avoid eating anything solid for at least 8 hours prior to your procedure. Clear liquid intake: You may take clear liquids such as water up to 2 hours prior to your procedure. (No carbonated drinks. No soda.) Transportation: Unless otherwise stated by your physician, bring a driver. Morning Medicines: Except for blood thinners, take all of your other morning medications with a sip of water. Make sure to take your heart and blood pressure medicines. If your blood pressure's lower number is above 100, the case will be rescheduled. Blood thinners: Make sure to stop your blood thinners as instructed.  If you take  a blood thinner, but were not instructed to stop it, call our office [phone] and ask to talk to a nurse. Not stopping a blood thinner prior to certain procedures could lead to serious complications. Diabetics on insulin: Notify the staff so that you can be scheduled 1st case in the morning. If your diabetes requires high dose insulin, take only  of your normal insulin dose the morning of the procedure and notify the staff that you have done so. Preventing infections: Shower with an antibacterial soap the morning of your procedure.  Build-up your immune system: Take 1000 mg of Vitamin C with every meal (3 times a day) the day prior to your procedure. Antibiotics: Inform the nursing staff if you are taking any antibiotics or if you have any conditions that may require antibiotics prior to procedures. (Example: recent joint implants)   Pregnancy: If you are pregnant make sure to notify the nursing staff. Not doing so may result in injury to the fetus, including death.  Sickness: If you have a cold, fever, or any active infections, call and cancel or reschedule your procedure. Receiving steroids while having an infection may result in complications. Arrival: You must be in the facility at least 30 minutes prior to your scheduled procedure. Tardiness: Your scheduled time is also the cutoff time. If you do not arrive at least 15 minutes prior to your procedure, you will be rescheduled.  Children: Do not bring any children with you. Make arrangements to keep them home. Dress appropriately: There is always a possibility that your clothing may get soiled. Avoid long dresses. Valuables: Do not bring any jewelry or valuables.  Reasons to call and reschedule or cancel your procedure: (Following these recommendations will minimize the risk   of a serious complication.) Surgeries: Avoid having procedures within 2 weeks of any surgery. (Avoid for 2 weeks before or after any surgery). Flu Shots: Avoid having  procedures within 2 weeks of a flu shots or . (Avoid for 2 weeks before or after immunizations). Barium: Avoid having a procedure within 7-10 days after having had a radiological study involving the use of radiological contrast. (Myelograms, Barium swallow or enema study). Heart attacks: Avoid any elective procedures or surgeries for the initial 6 months after a "Myocardial Infarction" (Heart Attack). Blood thinners: It is imperative that you stop these medications before procedures. Let us know if you if you take any blood thinner.  Infection: Avoid procedures during or within two weeks of an infection (including chest colds or gastrointestinal problems). Symptoms associated with infections include: Localized redness, fever, chills, night sweats or profuse sweating, burning sensation when voiding, cough, congestion, stuffiness, runny nose, sore throat, diarrhea, nausea, vomiting, cold or Flu symptoms, recent or current infections. It is specially important if the infection is over the area that we intend to treat. Heart and lung problems: Symptoms that may suggest an active cardiopulmonary problem include: cough, chest pain, breathing difficulties or shortness of breath, dizziness, ankle swelling, uncontrolled high or unusually low blood pressure, and/or palpitations. If you are experiencing any of these symptoms, cancel your procedure and contact your primary care physician for an evaluation.  Remember:  Regular Business hours are:  Monday to Thursday 8:00 AM to 4:00 PM  Provider's Schedule: Francisco Naveira, MD:  Procedure days: Tuesday and Thursday 7:30 AM to 4:00 PM  Bilal Lateef, MD:  Procedure days: Monday and Wednesday 7:30 AM to 4:00 PM  ______________________________________________________________________   Selective Nerve Root Block Patient Information  Description: Specific nerve roots exit the spinal canal and these nerves can be compressed and inflamed by a bulging disc and  bone spurs.  By injecting steroids on the nerve root, we can potentially decrease the inflammation surrounding these nerves, which often leads to decreased pain.  Also, by injecting local anesthesia on the nerve root, this can provide us helpful information to give to your referring doctor if it decreases your pain.  Selective nerve root blocks can be done along the spine from the neck to the low back depending on the location of your pain.   After numbing the skin with local anesthesia, a small needle is passed to the nerve root and the position of the needle is verified using x-ray pictures.  After the needle is in correct position, we then deposit the medication.  You may experience a pressure sensation while this is being done.  The entire block usually lasts less than 15 minutes.  Conditions that may be treated with selective nerve root blocks: Low back and leg pain Spinal stenosis Diagnostic block prior to potential surgery Neck and arm pain Post laminectomy syndrome  Preparation for the injection:  Do not eat any solid food or dairy products within 8 hours of your appointment. You may drink clear liquids up to 3 hours before an appointment.  Clear liquids include water, black coffee, juice or soda.  No milk or cream please. You may take your regular medications, including pain medications, with a sip of water before your appointment.  Diabetics should hold regular insulin (if taken separately) and take 1/2 normal NPH dose the morning of the procedure.  Carry some sugar containing items with you to your appointment. A driver must accompany you and be prepared to drive you home   after your procedure. Bring all your current medications with you. An IV may be inserted and sedation may be given at the discretion of the physician. A blood pressure cuff, EKG, and other monitors will often be applied during the procedure.  Some patients may need to have extra oxygen administered for a short  period. You will be asked to provide medical information, including allergies, prior to the procedure.  We must know immediately if you are taking blood  Thinners (like Coumadin) or if you are allergic to IV iodine contrast (dye).  Possible side-effects: All are usually temporary Bleeding from needle site Light headedness Numbness and tingling Decreased blood pressure Weakness in arms/legs Pressure sensation in back/neck Pain at injection site (several days)  Possible complications: All are extremely rare Infection Nerve injury Spinal headache (a headache wore with upright position)  Call if you experience: Fever/chills associated with headache or increased back/neck pain Headache worsened by an upright position New onset weakness or numbness of an extremity below the injection site Hives or difficulty breathing (go to the emergency room) Inflammation or drainage at the injection site(s) Severe back/neck pain greater than usual New symptoms which are concerning to you  Please note:  Although the local anesthetic injected can often make your back or neck feel good for several hours after the injection the pain will likely return.  It takes 3-5 days for steroids to work on the nerve root. You may not notice any pain relief for at least one week.  If effective, we will often do a series of 3 injections spaced 3-6 weeks apart to maximally decrease your pain.    If you have any questions, please call [phone] [facility] 

## 2022-11-19 ENCOUNTER — Ambulatory Visit
Admission: RE | Admit: 2022-11-19 | Discharge: 2022-11-19 | Disposition: A | Discharge status: Home / Self Care | Payer: 59 | Source: Ambulatory Visit | Attending: Student in an Organized Health Care Education/Training Program | Admitting: Student in an Organized Health Care Education/Training Program

## 2022-11-19 ENCOUNTER — Encounter: Payer: Self-pay | Admitting: Student in an Organized Health Care Education/Training Program

## 2022-11-19 ENCOUNTER — Ambulatory Visit
Payer: 59 | Attending: Student in an Organized Health Care Education/Training Program | Admitting: Student in an Organized Health Care Education/Training Program

## 2022-11-19 DIAGNOSIS — M48061 Spinal stenosis, lumbar region without neurogenic claudication: Secondary | ICD-10-CM | POA: Insufficient documentation

## 2022-11-19 DIAGNOSIS — M5416 Radiculopathy, lumbar region: Secondary | ICD-10-CM | POA: Diagnosis not present

## 2022-11-19 DIAGNOSIS — G8929 Other chronic pain: Secondary | ICD-10-CM

## 2022-11-19 MED ORDER — DEXAMETHASONE SODIUM PHOSPHATE 10 MG/ML IJ SOLN
INTRAMUSCULAR | Status: AC
  Filled 2022-11-19: qty 1

## 2022-11-19 MED ORDER — DEXAMETHASONE SODIUM PHOSPHATE 10 MG/ML IJ SOLN
20.0000 mg | Freq: Once | INTRAMUSCULAR | Status: AC
  Administered 2022-11-19: 20 mg

## 2022-11-19 MED ORDER — IOHEXOL 180 MG/ML  SOLN
10.0000 mL | Freq: Once | INTRAMUSCULAR | Status: AC
  Administered 2022-11-19: 10 mL via EPIDURAL

## 2022-11-19 MED ORDER — SODIUM CHLORIDE (PF) 0.9 % IJ SOLN
INTRAMUSCULAR | Status: AC
  Filled 2022-11-19: qty 10

## 2022-11-19 MED ORDER — SODIUM CHLORIDE 0.9% FLUSH
2.0000 mL | Freq: Once | INTRAVENOUS | Status: AC
  Administered 2022-11-19: 2 mL

## 2022-11-19 MED ORDER — IOHEXOL 180 MG/ML  SOLN
INTRAMUSCULAR | Status: AC
  Filled 2022-11-19: qty 20

## 2022-11-19 MED ORDER — LIDOCAINE HCL (PF) 2 % IJ SOLN
INTRAMUSCULAR | Status: AC
  Filled 2022-11-19: qty 10

## 2022-11-19 MED ORDER — ROPIVACAINE HCL 2 MG/ML IJ SOLN
2.0000 mL | Freq: Once | INTRAMUSCULAR | Status: AC
  Administered 2022-11-19: 2 mL via EPIDURAL

## 2022-11-19 MED ORDER — LIDOCAINE HCL 2 % IJ SOLN
20.0000 mL | Freq: Once | INTRAMUSCULAR | Status: AC
  Administered 2022-11-19: 200 mg

## 2022-11-19 MED ORDER — ROPIVACAINE HCL 2 MG/ML IJ SOLN
INTRAMUSCULAR | Status: AC
  Filled 2022-11-19: qty 20

## 2022-11-19 NOTE — Progress Notes (Signed)
PROVIDER NOTE: Interpretation of information contained herein should be left to medically-trained personnel. Specific patient instructions are provided elsewhere under "Patient Instructions" section of medical record. This document was created in part using STT-dictation technology, any transcriptional errors that may result from this process are unintentional.  Patient: Laura Gillespie Type: Established DOB: 1961-02-04 MRN: [id number] PCP: Redmond School, MD  Service: Procedure DOS: 11/19/2022 Setting: Ambulatory Location: Ambulatory outpatient facility Delivery: Face-to-face Provider: Gillis Santa, MD Specialty: Interventional Pain Management Specialty designation: 09 Location: Outpatient facility Ref. Prov.: Gillis Santa, MD       Interventional Therapy   Procedure: Lumbar trans-foraminal epidural steroid injection (L-TFESI) #1  Laterality: Left (-LT)  Level: L2 and L3 nerve root(s) Imaging: Fluoroscopy-guided         Anesthesia: Local anesthesia (1-2% Lidocaine) DOS: 11/19/2022  Performed by: Gillis Santa, MD  Purpose: Diagnostic/Therapeutic Indications: Lumbar radicular pain severe enough to impact quality of life or function. 1. Lumbar radiculopathy   2. Chronic radicular lumbar pain   3. 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  From the lower tip of the scapula down to the tailbone and from flank to flank. Materials:  Tray: Block Needle(s):  Type: Spinal  Gauge (G): 22  Length: 5-in  Qty: 2      Pre-op H&P Assessment:  Laura Gillespie is a 62 y.o. (year old), female patient, seen today for interventional treatment. She  has a past surgical history that includes Cholecystectomy;  Carpal tunnel release; Lung biopsy; Tonsillectomy; Tubal ligation; Cardiac catheterization; Coronary angioplasty (12/2013); Lobectomy (Right, 1991); and Colonoscopy (N/A, 05/24/2020). Laura Gillespie has a current medication list which includes the following prescription(s): acetaminophen, albuterol, amlodipine, dextromethorphan-guaifenesin, ibuprofen, ipratropium-albuterol, rosuvastatin, amitriptyline, benzonatate, ibuprofen, naproxen, and promethazine-dextromethorphan. Her primarily concern today is the Back Pain (Left, lower)  Initial Vital Signs:  Pulse/HCG Rate: 96ECG Heart Rate: 98 Temp: 98.2 F (36.8 C) Resp: 14 BP: (!) 157/86 SpO2: 98 %  BMI: Estimated body mass index is 35.43 kg/m as calculated from the following:   Height as of this encounter: 5\' 3"  (1.6 m).   Weight as of this encounter: 200 lb (90.7 kg).  Risk Assessment: Allergies: Reviewed. She is allergic to morphine and related and percocet [oxycodone-acetaminophen].  Allergy Precautions: None required Coagulopathies: Reviewed. None identified.  Blood-thinner therapy: None at this time Active Infection(s): Reviewed. None identified. Laura Gillespie is afebrile  Site Confirmation: Laura Gillespie was asked to confirm the procedure and laterality before marking the site Procedure checklist: Completed Consent: Before the procedure and under the influence of no sedative(s), amnesic(s), or anxiolytics, the patient was informed of the treatment options, risks and possible complications. To fulfill our ethical and legal obligations, as recommended by the American Medical Association's Code of Ethics, I have informed the patient of my clinical impression; the nature and purpose of the treatment or procedure; the risks, benefits, and possible complications of the intervention; the alternatives, including doing nothing; the risk(s) and benefit(s) of the alternative treatment(s) or procedure(s); and the risk(s) and benefit(s) of doing nothing. The  patient was provided information about the general risks and possible complications associated with the procedure. These may include, but are not limited to: failure to achieve desired goals,  infection, bleeding, organ or nerve damage, allergic reactions, paralysis, and death. In addition, the patient was informed of those risks and complications associated to Spine-related procedures, such as failure to decrease pain; infection (i.e.: Meningitis, epidural or intraspinal abscess); bleeding (i.e.: epidural hematoma, subarachnoid hemorrhage, or any other type of intraspinal or peri-dural bleeding); organ or nerve damage (i.e.: Any type of peripheral nerve, nerve root, or spinal cord injury) with subsequent damage to sensory, motor, and/or autonomic systems, resulting in permanent pain, numbness, and/or weakness of one or several areas of the body; allergic reactions; (i.e.: anaphylactic reaction); and/or death. Furthermore, the patient was informed of those risks and complications associated with the medications. These include, but are not limited to: allergic reactions (i.e.: anaphylactic or anaphylactoid reaction(s)); adrenal axis suppression; blood sugar elevation that in diabetics may result in ketoacidosis or comma; water retention that in patients with history of congestive heart failure may result in shortness of breath, pulmonary edema, and decompensation with resultant heart failure; weight gain; swelling or edema; medication-induced neural toxicity; particulate matter embolism and blood vessel occlusion with resultant organ, and/or nervous system infarction; and/or aseptic necrosis of one or more joints. Finally, the patient was informed that Medicine is not an exact science; therefore, there is also the possibility of unforeseen or unpredictable risks and/or possible complications that may result in a catastrophic outcome. The patient indicated having understood very clearly. We have given the patient no  guarantees and we have made no promises. Enough time was given to the patient to ask questions, all of which were answered to the patient's satisfaction. Laura Gillespie has indicated that she wanted to continue with the procedure. Attestation: I, the ordering provider, attest that I have discussed with the patient the benefits, risks, side-effects, alternatives, likelihood of achieving goals, and potential problems during recovery for the procedure that I have provided informed consent. Date  Time: 11/19/2022  1:26 PM   Pre-Procedure Preparation:  Monitoring: As per clinic protocol. Respiration, ETCO2, SpO2, BP, heart rate and rhythm monitor placed and checked for adequate function Safety Precautions: Patient was assessed for positional comfort and pressure points before starting the procedure. Time-out: I initiated and conducted the "Time-out" before starting the procedure, as per protocol. The patient was asked to participate by confirming the accuracy of the "Time Out" information. Verification of the correct person, site, and procedure were performed and confirmed by me, the nursing staff, and the patient. "Time-out" conducted as per Joint Commission's Universal Protocol (UP.01.01.01). Time: 1400 Start Time: 1400 hrs.  Description/Narrative of Procedure:          Target: The 6 o'clock position under the pedicle, on the affected side. Region: Posterolateral Lumbosacral Approach: Posterior Percutaneous Paravertebral approach.  Rationale (medical necessity): procedure needed and proper for the diagnosis and/or treatment of the patient's medical symptoms and needs. Procedural Technique Safety Precautions: Aspiration looking for blood return was conducted prior to all injections. At no point did we inject any substances, as a needle was being advanced. No attempts were made at seeking any paresthesias. Safe injection practices and needle disposal techniques used. Medications properly checked for  expiration dates. SDV (single dose vial) medications used. Description of the Procedure: Protocol guidelines were followed. The patient was placed in position over the procedure table. The target area was identified and the area prepped in the usual manner. Skin & deeper tissues infiltrated with local anesthetic. Appropriate amount of time allowed to pass for local anesthetics to take effect. The procedure needles were then advanced  to the target area. Proper needle placement secured. Negative aspiration confirmed. Solution injected in intermittent fashion, asking for systemic symptoms every 0.5cc of injectate. The needles were then removed and the area cleansed, making sure to leave some of the prepping solution back to take advantage of its long term bactericidal properties.  4 cc solution made of 1 cc of preservative-free saline, 1cc of 0.2% ropivacaine, 2cc of Decadron 10 mg/cc. 2cc injected for left L2 and left L3 nerves   Vitals:   11/19/22 1408 11/19/22 1411 11/19/22 1413 11/19/22 1418  BP: (!) 155/86 (!) 156/86 (!) 154/91 (!) 164/98  Pulse:      Resp: 15 13 16 13   Temp:      TempSrc:      SpO2: 99% 100% 100% 99%  Weight:      Height:        Start Time: 1400 hrs. End Time: 1418 hrs.  Imaging Guidance (Spinal):          Type of Imaging Technique: Fluoroscopy Guidance (Spinal) Indication(s): Assistance in needle guidance and placement for procedures requiring needle placement in or near specific anatomical locations not easily accessible without such assistance. Exposure Time: Please see nurses notes. Contrast: Before injecting any contrast, we confirmed that the patient did not have an allergy to iodine, shellfish, or radiological contrast. Once satisfactory needle placement was completed at the desired level, radiological contrast was injected. Contrast injected under live fluoroscopy. No contrast complications. See chart for type and volume of contrast used. Fluoroscopic Guidance:  I was personally present during the use of fluoroscopy. "Tunnel Vision Technique" used to obtain the best possible view of the target area. Parallax error corrected before commencing the procedure. "Direction-depth-direction" technique used to introduce the needle under continuous pulsed fluoroscopy. Once target was reached, antero-posterior, oblique, and lateral fluoroscopic projection used confirm needle placement in all planes. Images permanently stored in EMR. Interpretation: I personally interpreted the imaging intraoperatively. Adequate needle placement confirmed in multiple planes. Appropriate spread of contrast into desired area was observed. No evidence of afferent or efferent intravascular uptake. No intrathecal or subarachnoid spread observed. Permanent images saved into the patient's record.  Post-operative Assessment:  Post-procedure Vital Signs:  Pulse/HCG Rate: 9694 Temp: 98.2 F (36.8 C) Resp: 13 BP: (!) 164/98 SpO2: 99 %  EBL: None  Complications: No immediate post-treatment complications observed by team, or reported by patient.  Note: The patient tolerated the entire procedure well. A repeat set of vitals were taken after the procedure and the patient was kept under observation following institutional policy, for this type of procedure. Post-procedural neurological assessment was performed, showing return to baseline, prior to discharge. The patient was provided with post-procedure discharge instructions, including a section on how to identify potential problems. Should any problems arise concerning this procedure, the patient was given instructions to immediately contact us, at any time, without hesitation. In any case, we plan to contact the patient by telephone for a follow-up status report regarding this interventional procedure.  Comments:  No additional relevant information.  Plan of Care (POC)  Orders:  Orders Placed This Encounter  Procedures   DG PAIN [facility] C-ARM  1-60 MIN NO REPORT    Intraoperative interpretation by procedural physician at [place].    Standing Status:   Standing    Number of Occurrences:   1    Order Specific Question:   Reason for exam:    Answer:   Assistance in needle guidance and placement for procedures requiring  needle placement in or near specific anatomical locations not easily accessible without such assistance.    Medications ordered for procedure: Meds ordered this encounter  Medications   iohexol (OMNIPAQUE) 180 MG/ML injection 10 mL    Must be Myelogram-compatible. If not available, you may substitute with a water-soluble, non-ionic, hypoallergenic, myelogram-compatible radiological contrast medium.   lidocaine (XYLOCAINE) 2 % (with pres) injection 400 mg   sodium chloride flush (NS) 0.9 % injection 2 mL    This is for a two (2) level block. Use two (2) syringes and divide content in half.   ropivacaine (PF) 2 mg/mL (0.2%) (NAROPIN) injection 2 mL    This is for a two (2) level block. Use two (2) syringes and divide content in half.   dexamethasone (DECADRON) injection 20 mg    This is for a two (2) level block. Use two (2) syringes and divide content in half.   Medications administered: We administered iohexol, lidocaine, sodium chloride flush, ropivacaine (PF) 2 mg/mL (0.2%), and dexamethasone.  See the medical record for exact dosing, route, and time of administration.  Follow-up plan:   Return in about 4 weeks (around 12/17/2022) for Post Procedure Evaluation, in person.       Recent Visits Date Type Provider Dept  11/11/22 Office Visit Gillis Santa, MD [facility]  Showing recent visits within past 90 days and meeting all other requirements Today's Visits Date Type Provider Dept  11/19/22 Procedure visit Gillis Santa, MD [facility]  Showing today's visits and meeting all other requirements Future Appointments Date Type Provider Dept  12/22/22 Appointment Gillis Santa, MD [facility]  Showing future appointments within next 90 days and meeting all other requirements  Disposition: Discharge home  Discharge (Date  Time): 11/19/2022; 1424 hrs.   Primary Care Physician: Redmond School, MD Location: [facility] Note by: Gillis Santa, MD (TTS technology used. I apologize for any typographical errors that were not detected and corrected.) Date: 11/19/2022; Time: 3:03 PM  Disclaimer:  Medicine is not an Chief Strategy Officer. The only guarantee in medicine is that nothing is guaranteed. It is important to note that the decision to proceed with this intervention was based on the information collected from the patient. The Data and conclusions were drawn from the patient's questionnaire, the interview, and the physical examination. Because the information was provided in large part by the patient, it cannot be guaranteed that it has not been purposely or unconsciously manipulated. Every effort has been made to obtain as much relevant data as possible for this evaluation. It is important to note that the conclusions that lead to this procedure are derived in large part from the available data. Always take into account that the treatment will also be dependent on availability of resources and existing treatment guidelines, considered by other Pain Management Practitioners as being common knowledge and practice, at the time of the intervention. For Medico-Legal purposes, it is also important to point out that variation in procedural techniques and pharmacological choices are the acceptable norm. The indications, contraindications, technique, and results of the above procedure should only be interpreted and judged by a Board-Certified Interventional Pain Specialist with extensive familiarity and expertise in the same exact procedure and technique.

## 2022-11-19 NOTE — Patient Instructions (Signed)

## 2022-11-20 ENCOUNTER — Telehealth: Payer: Self-pay

## 2022-11-20 NOTE — Telephone Encounter (Signed)
Call to patient to discuss post-procedure evaluation. States pain has improved  from procedure site however has noticed increased pain the back. Informed her to use ice and note it in the pain diary. Patient states she has a headache. Informed her steroid can increase blood sugar and encourage her to check blood sugar today. Patient denies any other concerns.    Al Decant, RN

## 2022-12-08 DIAGNOSIS — M48061 Spinal stenosis, lumbar region without neurogenic claudication: Secondary | ICD-10-CM | POA: Diagnosis not present

## 2022-12-15 ENCOUNTER — Telehealth: Payer: Self-pay | Admitting: Student in an Organized Health Care Education/Training Program

## 2022-12-15 NOTE — Telephone Encounter (Signed)
PT stated that injections didn't help at all. PT stated that she don't want to move forward with anymore injections

## 2022-12-22 ENCOUNTER — Ambulatory Visit: Payer: 59 | Admitting: Student in an Organized Health Care Education/Training Program

## 2023-01-13 ENCOUNTER — Other Ambulatory Visit: Payer: Self-pay

## 2023-01-13 ENCOUNTER — Other Ambulatory Visit: Payer: Self-pay | Admitting: Cardiovascular Disease

## 2023-01-13 MED ORDER — AMLODIPINE BESYLATE 10 MG PO TABS
10.0000 mg | ORAL_TABLET | Freq: Every day | ORAL | 0 refills | Status: DC
  Filled 2023-01-13: qty 90, 90d supply, fill #0

## 2023-01-13 MED FILL — Rosuvastatin Calcium Tab 40 MG: ORAL | 90 days supply | Qty: 90 | Fill #0 | Status: AC

## 2023-01-13 NOTE — Telephone Encounter (Signed)
Please contact pt for future appointment. Pt due for OD 12 month f/u. Pt needing refills.

## 2023-01-14 ENCOUNTER — Other Ambulatory Visit: Payer: Self-pay

## 2023-03-05 ENCOUNTER — Other Ambulatory Visit
Admission: RE | Admit: 2023-03-05 | Discharge: 2023-03-05 | Disposition: A | Discharge status: Home / Self Care | Payer: 59 | Attending: Internal Medicine | Admitting: Internal Medicine

## 2023-03-05 DIAGNOSIS — Z0001 Encounter for general adult medical examination with abnormal findings: Secondary | ICD-10-CM | POA: Insufficient documentation

## 2023-03-05 DIAGNOSIS — E7849 Other hyperlipidemia: Secondary | ICD-10-CM | POA: Diagnosis not present

## 2023-03-05 DIAGNOSIS — R7309 Other abnormal glucose: Secondary | ICD-10-CM | POA: Insufficient documentation

## 2023-03-05 DIAGNOSIS — Z9229 Personal history of other drug therapy: Secondary | ICD-10-CM | POA: Insufficient documentation

## 2023-03-05 LAB — CBC WITH DIFFERENTIAL/PLATELET
Abs Immature Granulocytes: 0.02 10*3/uL (ref 0.00–0.07)
Basophils Absolute: 0.1 10*3/uL (ref 0.0–0.1)
Basophils Relative: 1 %
Eosinophils Absolute: 0.1 10*3/uL (ref 0.0–0.5)
Eosinophils Relative: 1 %
HCT: 41 % (ref 36.0–46.0)
Hemoglobin: 13.7 g/dL (ref 12.0–15.0)
Immature Granulocytes: 0 %
Lymphocytes Relative: 26 %
Lymphs Abs: 2 10*3/uL (ref 0.7–4.0)
MCH: 30.2 pg (ref 26.0–34.0)
MCHC: 33.4 g/dL (ref 30.0–36.0)
MCV: 90.3 fL (ref 80.0–100.0)
Monocytes Absolute: 0.5 10*3/uL (ref 0.1–1.0)
Monocytes Relative: 6 %
Neutro Abs: 5.2 10*3/uL (ref 1.7–7.7)
Neutrophils Relative %: 66 %
Platelets: 294 10*3/uL (ref 150–400)
RBC: 4.54 MIL/uL (ref 3.87–5.11)
RDW: 12.8 % (ref 11.5–15.5)
WBC: 7.8 10*3/uL (ref 4.0–10.5)
nRBC: 0 % (ref 0.0–0.2)

## 2023-03-05 LAB — COMPREHENSIVE METABOLIC PANEL
ALT: 16 U/L (ref 0–44)
AST: 23 U/L (ref 15–41)
Albumin: 4.6 g/dL (ref 3.5–5.0)
Alkaline Phosphatase: 84 U/L (ref 38–126)
Anion gap: 8 (ref 5–15)
BUN: 8 mg/dL (ref 6–20)
CO2: 23 mmol/L (ref 22–32)
Calcium: 9.3 mg/dL (ref 8.9–10.3)
Chloride: 104 mmol/L (ref 98–111)
Creatinine, Ser: 0.75 mg/dL (ref 0.44–1.00)
GFR, Estimated: >60 mL/min (ref >60)
Glucose, Bld: 136 mg/dL — ABNORMAL HIGH (ref 70–99)
Potassium: 3.8 mmol/L (ref 3.5–5.1)
Sodium: 135 mmol/L (ref 135–145)
Total Bilirubin: 0.7 mg/dL (ref 0.3–1.2)
Total Protein: 7.2 g/dL (ref 6.5–8.1)

## 2023-03-05 LAB — VITAMIN B12: Vitamin B-12: 2225 pg/mL — ABNORMAL HIGH (ref 180–914)

## 2023-03-05 LAB — LIPID PANEL
Cholesterol: 118 mg/dL (ref 0–200)
HDL: 54 mg/dL (ref >40)
LDL Cholesterol: 45 mg/dL (ref 0–99)
Total CHOL/HDL Ratio: 2.2 RATIO
Triglycerides: 93 mg/dL (ref <150)
VLDL: 19 mg/dL (ref 0–40)

## 2023-03-05 LAB — VITAMIN D 25 HYDROXY (VIT D DEFICIENCY, FRACTURES): Vit D, 25-Hydroxy: 32.62 ng/mL (ref 30–100)

## 2023-03-05 LAB — TSH: TSH: 1.611 u[IU]/mL (ref 0.350–4.500)

## 2023-03-06 LAB — HEMOGLOBIN A1C
Hgb A1c MFr Bld: 5.7 % — ABNORMAL HIGH (ref 4.8–5.6)
Mean Plasma Glucose: 117 mg/dL

## 2023-03-20 ENCOUNTER — Other Ambulatory Visit: Payer: Self-pay

## 2023-03-22 ENCOUNTER — Other Ambulatory Visit: Payer: Self-pay

## 2023-03-23 ENCOUNTER — Other Ambulatory Visit: Payer: Self-pay

## 2023-03-23 ENCOUNTER — Other Ambulatory Visit: Payer: Self-pay | Admitting: Internal Medicine

## 2023-03-23 DIAGNOSIS — Z122 Encounter for screening for malignant neoplasm of respiratory organs: Secondary | ICD-10-CM

## 2023-03-23 MED ORDER — ALBUTEROL SULFATE HFA 108 (90 BASE) MCG/ACT IN AERS
1.0000 | INHALATION_SPRAY | RESPIRATORY_TRACT | 10 refills | Status: DC | PRN
  Filled 2023-03-23: qty 20.1, 75d supply, fill #0
  Filled 2023-04-28 – 2023-06-09 (×3): qty 20.1, 75d supply, fill #1
  Filled 2023-10-08: qty 20.1, 75d supply, fill #2
  Filled 2024-01-26: qty 20.1, 75d supply, fill #3

## 2023-04-01 ENCOUNTER — Ambulatory Visit
Admission: RE | Admit: 2023-04-01 | Discharge: 2023-04-01 | Disposition: A | Discharge status: Home / Self Care | Payer: 59 | Source: Ambulatory Visit | Attending: Internal Medicine | Admitting: Internal Medicine

## 2023-04-01 DIAGNOSIS — Z122 Encounter for screening for malignant neoplasm of respiratory organs: Secondary | ICD-10-CM | POA: Insufficient documentation

## 2023-04-01 DIAGNOSIS — Z87891 Personal history of nicotine dependence: Secondary | ICD-10-CM | POA: Diagnosis not present

## 2023-04-28 ENCOUNTER — Other Ambulatory Visit: Payer: Self-pay

## 2023-04-29 ENCOUNTER — Other Ambulatory Visit: Payer: Self-pay

## 2023-04-29 MED ORDER — AMLODIPINE BESYLATE 10 MG PO TABS
10.0000 mg | ORAL_TABLET | Freq: Every day | ORAL | 1 refills | Status: DC
  Filled 2023-04-29: qty 90, 90d supply, fill #0
  Filled 2023-08-16: qty 90, 90d supply, fill #1

## 2023-04-30 ENCOUNTER — Other Ambulatory Visit: Payer: Self-pay

## 2023-05-05 ENCOUNTER — Other Ambulatory Visit: Payer: Self-pay

## 2023-05-05 MED FILL — Rosuvastatin Calcium Tab 40 MG: ORAL | 90 days supply | Qty: 90 | Fill #1 | Status: AC

## 2023-06-09 ENCOUNTER — Other Ambulatory Visit: Payer: Self-pay

## 2023-06-11 ENCOUNTER — Other Ambulatory Visit: Payer: Self-pay

## 2023-06-11 MED ORDER — FLULAVAL 0.5 ML IM SUSY
PREFILLED_SYRINGE | INTRAMUSCULAR | 0 refills | Status: AC
  Filled 2023-06-11: qty 0.5, 1d supply, fill #0

## 2023-07-21 ENCOUNTER — Ambulatory Visit
Admission: EM | Admit: 2023-07-21 | Discharge: 2023-07-21 | Disposition: A | Discharge status: Home / Self Care | Payer: 59 | Attending: Emergency Medicine | Admitting: Emergency Medicine

## 2023-07-21 DIAGNOSIS — F17200 Nicotine dependence, unspecified, uncomplicated: Secondary | ICD-10-CM

## 2023-07-21 DIAGNOSIS — J439 Emphysema, unspecified: Secondary | ICD-10-CM | POA: Diagnosis not present

## 2023-07-21 DIAGNOSIS — J069 Acute upper respiratory infection, unspecified: Secondary | ICD-10-CM | POA: Diagnosis not present

## 2023-07-21 MED ORDER — AZITHROMYCIN 250 MG PO TABS: ORAL_TABLET | ORAL | 0 refills | Status: DC

## 2023-07-21 MED ORDER — PREDNISONE 50 MG PO TABS: ORAL_TABLET | ORAL | 0 refills | Status: DC

## 2023-07-21 NOTE — Discharge Instructions (Addendum)
Take prednisone as directed. Rest,push fluids, recommend stop smoking,handout given. May try using over the counter throat lozenges, mucinex or sudafed as label directed, hot tea, honey for cough.   If no improvement by 07/26/2023 may pick up your antibiotic and take as prescribed, antibiotic may cause upset stomach, resistance, photosensitivity; if you have improvement of symptoms no need to pick up antibiotic.

## 2023-07-21 NOTE — ED Triage Notes (Addendum)
Pt c/o chest congestion,cough,wheezing & HA x2 days. Hx of bronchitis.

## 2023-07-21 NOTE — ED Provider Notes (Signed)
[facility]    CSN: [id number] Arrival date & time: 07/21/23  1822      History   Chief Complaint Chief Complaint  Patient presents with   Chest Congestion   Headache   Cough   Wheezing    HPI Laura Gillespie is a 62 y.o. female.   62 year old female , Laura Gillespie, presents to urgent care for evaluation of chest congestion, head congestion, drainage x 2 days. Pt states her daughter has similar symptoms. Declines testing,requests steroid shot and abx shot if indicatedhas to work weekend.  Pt has past medical history to include hypertension, NSTEMI, right coronary artery stent with coronary atherosclerosis, hyperlipidemia, bronchitis, and nicotine dependence who presents for 2 day cough.  The history is provided by the patient. No language interpreter was used.    Past Medical History:  Diagnosis Date   Acute non-ST segment elevation myocardial infarction [facility])    Allergic rhinitis due to pollen    Anxiety disorder, unspecified    Atherosclerotic heart disease of native coronary artery without angina pectoris    Back pain    Bronchitis, not specified as acute or chronic    Chronic obstructive pulmonary disease with (acute) exacerbation (HCC)    Coronary artery disease 12/2013   s/p bare metal stent to distal RCA   Essential (primary) hypertension    Hyperlipidemia    Hyperlipidemia, unspecified    Hypertension    Nicotine dependence, unspecified, uncomplicated    Other hypersomnia     Patient Active Problem List   Diagnosis Date Noted   Hx of emphysema (HCC) 07/21/2023   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) 11/11/2022   Chronic radicular lumbar pain 11/11/2022   Encounter for screening fecal occult blood testing 04/22/2022   Encounter for gynecological examination with Papanicolaou smear of cervix 04/22/2022   Morbid obesity (HCC) 10/29/2015   Myalgia 07/17/2014   SOB (shortness of breath) 07/17/2014   S/P right  coronary artery (RCA) stent placement 01/17/2014   Coronary atherosclerosis of native coronary artery 01/17/2014   NSTEMI (non-ST elevated myocardial infarction) (HCC) 01/17/2014   Hyperlipidemia 01/17/2014   Smoker 01/17/2014   Viral URI with cough 01/17/2014    Past Surgical History:  Procedure Laterality Date   CARDIAC CATHETERIZATION     CARPAL TUNNEL RELEASE     both hand   CHOLECYSTECTOMY     COLONOSCOPY N/A 05/24/2020   Procedure: COLONOSCOPY;  Surgeon: Malissa Hippo, MD;  [facility];  Service: Endoscopy;  Laterality: N/A;  1030   CORONARY ANGIOPLASTY  12/2013   stent placement to the distal RCA   LOBECTOMY Right 1991   LUNG BIOPSY     TONSILLECTOMY     TUBAL LIGATION      OB History     Gravida  3   Para  2   Term  2   Preterm      AB  1   Living  2      SAB  1   IAB      Ectopic      Multiple      Live Births  2            Home Medications    Prior to Admission medications   Medication Sig Start Date End Date Taking? Authorizing Provider  acetaminophen (TYLENOL) 500 MG tablet Take 1,000 mg by mouth every 6 (six) hours as needed for moderate pain.    Yes [provider]  albuterol (VENTOLIN HFA) 108 (90 Base) MCG/ACT inhaler Inhale 1-2 puffs into the lungs every 4 (four) hours as needed. 03/23/23  Yes   amLODipine (NORVASC) 10 MG tablet Take 1 tablet (10 mg total) by mouth daily. 04/29/23  Yes   azithromycin (ZITHROMAX) 250 MG tablet Take as directed 07/26/23  Yes Aanika Defoor, Para March, NP  dextromethorphan-guaiFENesin (MUCINEX DM) 30-600 MG 12hr tablet Take 1 tablet by mouth 2 (two) times daily.   Yes [provider]  ibuprofen (ADVIL) 200 MG tablet Take 200 mg by mouth every 6 (six) hours as needed.   Yes [provider]  influenza vac split trivalent PF (FLULAVAL) 0.5 ML injection Inject into the muscle. 06/11/23  Yes Judyann Munson, MD  predniSONE (DELTASONE) 50 MG tablet Take 1 tablet by mouth daily x 5  days 07/21/23  Yes Damondre Pfeifle, Para March, NP  rosuvastatin (CRESTOR) 40 MG tablet Take 1 tablet (40 mg total) by mouth daily. 01/13/23  Yes Gollan, Tollie Pizza, MD  amitriptyline (ELAVIL) 25 MG tablet Take one tablet by mouth at bedtime for 7 nights; then take 2 tablets by mouth at bedtime thereafter as needed. Patient not taking: Reported on 11/11/2022 01/23/22     benzonatate (TESSALON) 100 MG capsule Take 2 capsules (200 mg total) by mouth every 8 (eight) hours. Patient not taking: Reported on 11/11/2022 10/18/22   Becky Augusta, NP  ibuprofen (ADVIL) 200 MG tablet Take 400 mg by mouth every 6 (six) hours as needed for moderate pain. Patient not taking: Reported on 11/11/2022    [provider]  ipratropium-albuterol (DUONEB) 0.5-2.5 (3) MG/3ML SOLN USE 1 VIAL ( ) VIA NEBULIZER 4 TIMES A DAY 02/11/22 02/11/23    Naproxen (NAPROSYN PO) Take by mouth. Patient not taking: Reported on 11/11/2022    [provider]  promethazine-dextromethorphan (PROMETHAZINE-DM) 6.25-15 MG/5ML syrup Take 5 mLs by mouth 4 (four) times daily as needed. Patient not taking: Reported on 11/11/2022 10/18/22   Becky Augusta, NP    Family History Family History  Problem Relation Age of Onset   Heart attack Paternal Grandfather    Dementia Paternal Grandmother    Other Maternal Grandmother        brain tumor   Heart attack Maternal Grandfather    Cancer Father        lung   Hypertension Father    Hyperlipidemia Father    Heart disease Father    Heart attack Father 62   Lymphoma Father    Hypertension Mother    Hyperlipidemia Mother    Atrial fibrillation Mother    Hypertension Son     Social History Social History   Tobacco Use   Smoking status: Every Day    Current packs/day: 0.50    Average packs/day: 0.5 packs/day for 35.0 years (17.5 ttl pk-yrs)    Types: Cigarettes   Smokeless tobacco: Never  Vaping Use   Vaping status: Some Days  Substance Use Topics   Alcohol use: Yes   Drug use: No      Allergies   Acetaminophen, Morphine and codeine, Oxycodone hcl, Propoxyphene, and Percocet [oxycodone-acetaminophen]   Review of Systems Review of Systems  HENT:  Positive for congestion.   Respiratory:  Positive for cough and wheezing.   Neurological:  Positive for headaches.  All other systems reviewed and are negative.    Physical Exam Triage Vital Signs ED Triage Vitals  Encounter Vitals Group     BP      Systolic BP Percentile  Diastolic BP Percentile      Pulse      Resp      Temp      Temp src      SpO2      Weight      Height      Head Circumference      Peak Flow      Pain Score      Pain Loc      Pain Education      Exclude from Growth Chart    No data found.  Updated Vital Signs BP (!) 148/89 (BP Location: Left Arm)   Pulse 92   Temp 98.5 F (36.9 C) (Oral)   Resp 16   Ht 5\' 3"  (1.6 m)   Wt 170 lb (77.1 kg)   SpO2 94%   BMI 30.11 kg/m   Visual Acuity Right Eye Distance:   Left Eye Distance:   Bilateral Distance:    Right Eye Near:   Left Eye Near:    Bilateral Near:     Physical Exam Vitals and nursing note reviewed.  Constitutional:      General: She is not in acute distress.    Appearance: She is well-developed and well-groomed.  HENT:     Head: Normocephalic and atraumatic.  Eyes:     Conjunctiva/sclera: Conjunctivae normal.  Cardiovascular:     Rate and Rhythm: Normal rate and regular rhythm.     Pulses: Normal pulses.     Heart sounds: Normal heart sounds. No murmur heard. Pulmonary:     Effort: Pulmonary effort is normal. No respiratory distress.     Comments: Coarse breath sounds throughout Abdominal:     Palpations: Abdomen is soft.     Tenderness: There is no abdominal tenderness.  Musculoskeletal:        General: No swelling.     Cervical back: Neck supple.  Skin:    General: Skin is warm and dry.     Capillary Refill: Capillary refill takes less than 2 seconds.  Neurological:     General: No focal  deficit present.     Mental Status: She is alert and oriented to person, place, and time.     GCS: GCS eye subscore is 4. GCS verbal subscore is 5. GCS motor subscore is 6.     Cranial Nerves: No cranial nerve deficit.     Sensory: No sensory deficit.  Psychiatric:        Attention and Perception: Attention normal.        Mood and Affect: Mood normal.        Speech: Speech normal.        Behavior: Behavior normal. Behavior is cooperative.      UC Treatments / Results  Labs (all labs ordered are listed, but only abnormal results are displayed) Labs Reviewed - No data to display  EKG   Radiology No results found.  Procedures Procedures (including critical care time)  Medications Ordered in UC Medications - No data to display  Initial Impression / Assessment and Plan / UC Course  I have reviewed the triage vital signs and the nursing notes.  Pertinent labs & imaging results that were available during my care of the patient were reviewed by me and considered in my medical decision making (see chart for details).    Discussed exam findings and plan of care with patient, strict go to ER precautions given.   Patient verbalized understanding to this provider.  Ddx: Viral  URI with cough, hx of COPD/emphysema, Smoker Final Clinical Impressions(s) / UC Diagnoses   Final diagnoses:  Hx of emphysema (HCC)  Smoker  Viral URI with cough     Discharge Instructions      Take prednisone as directed. Rest,push fluids, recommend stop smoking,handout given. May try using over the counter throat lozenges, mucinex or sudafed as label directed, hot tea, honey for cough.   If no improvement by 07/26/2023 may pick up your antibiotic and take as prescribed, antibiotic may cause upset stomach, resistance, photosensitivity; if you have improvement of symptoms no need to pick up antibiotic.      ED Prescriptions     Medication Sig Dispense Auth. Provider   predniSONE (DELTASONE) 50  MG tablet Take 1 tablet by mouth daily x 5 days 5 tablet Khair Chasteen, NP   azithromycin (ZITHROMAX) 250 MG tablet Take as directed 6 each Damian Buckles, Para March, NP      PDMP not reviewed this encounter.   Clancy Gourd, NP 07/21/23 [phone]

## 2023-08-15 IMAGING — US US THYROID
1 series · 14 of 25 positions shown · non-contrast
Comparison: CT chest 04/10/2021

CLINICAL DATA: Nontoxic single nodule seen on prior CT scan

EXAM:
THYROID ULTRASOUND
TECHNIQUE: Ultrasound examination of the thyroid gland and adjacent soft
tissues was performed.

[Series 1: us thyroid · 0.07mm/px · 56 acquisitions, 14 frames shown]
[im 1/56]
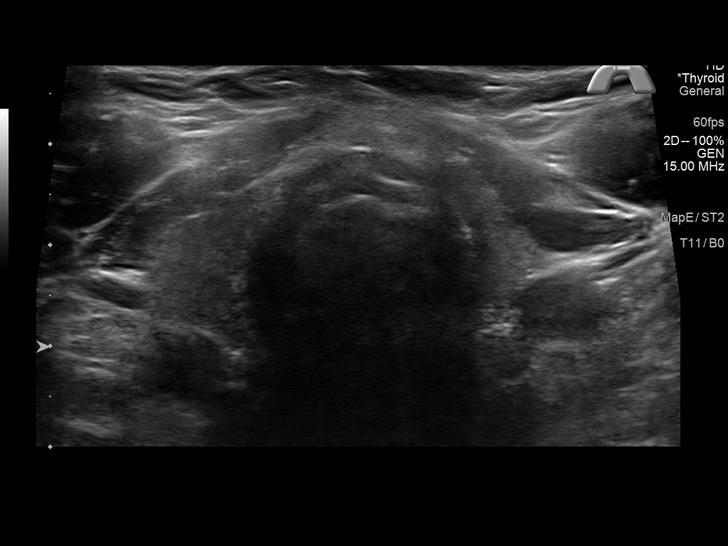
[im 5/56]
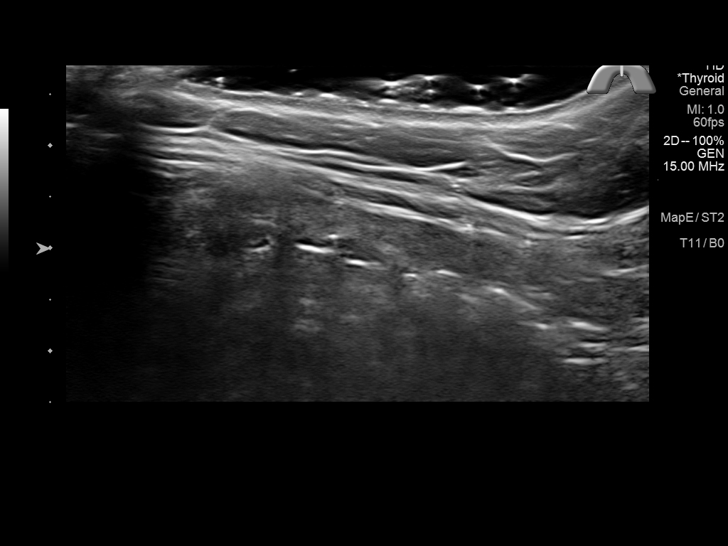
[im 10/56]
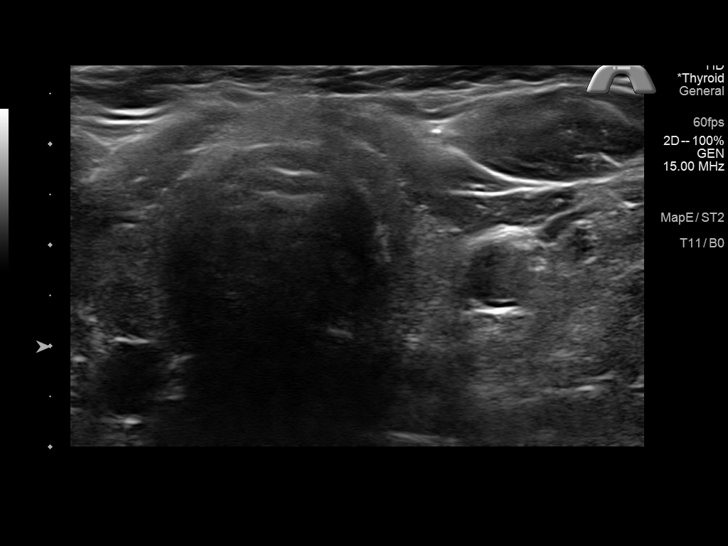
[im 14/56]
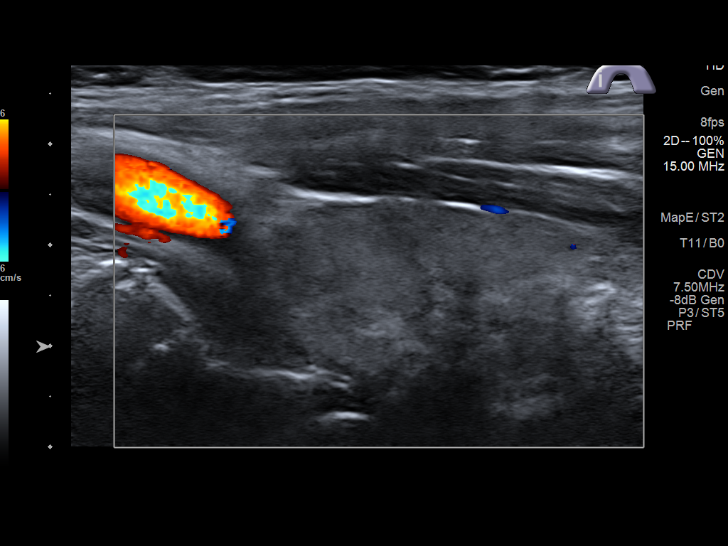
[im 19/56]
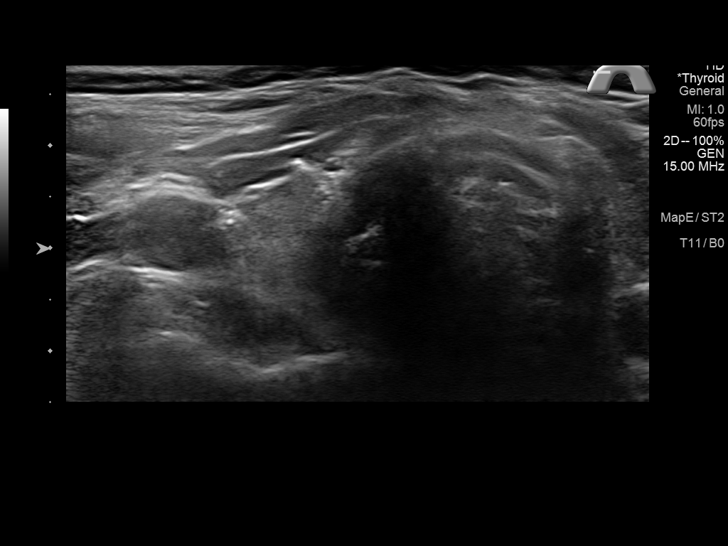
[im 21/56]
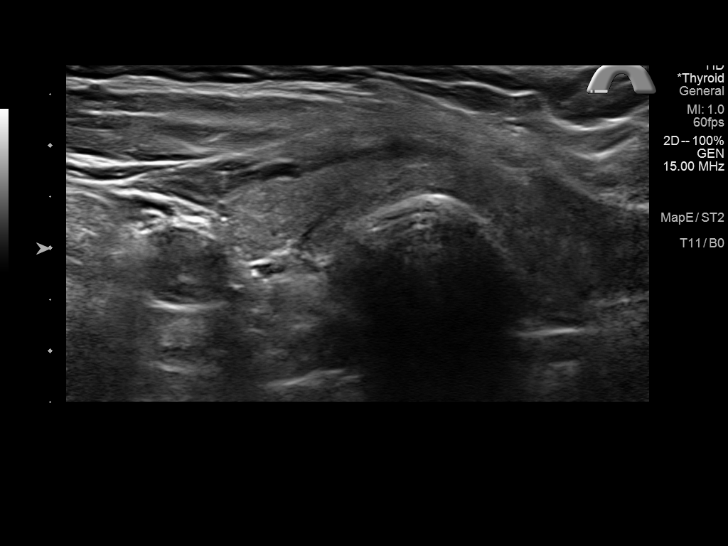
[im 26/56]
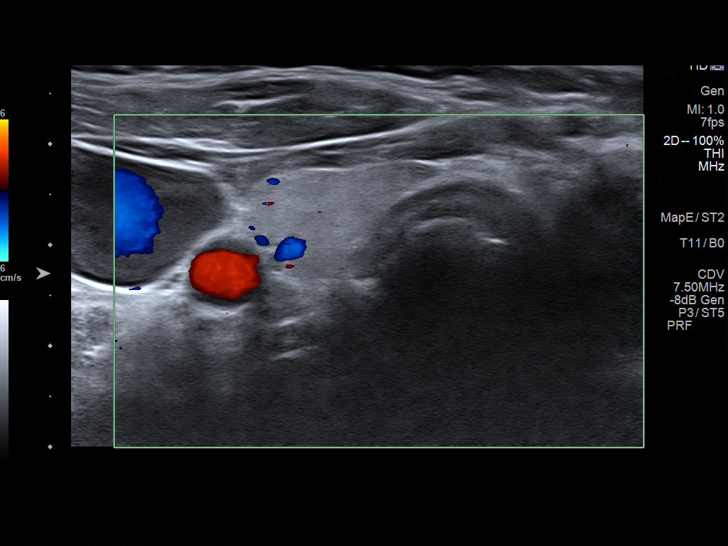
[im 30/56]
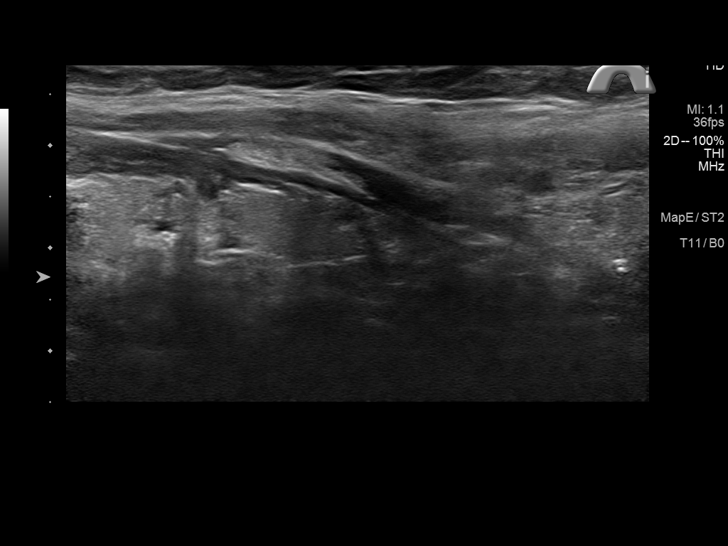
[im 35/56]
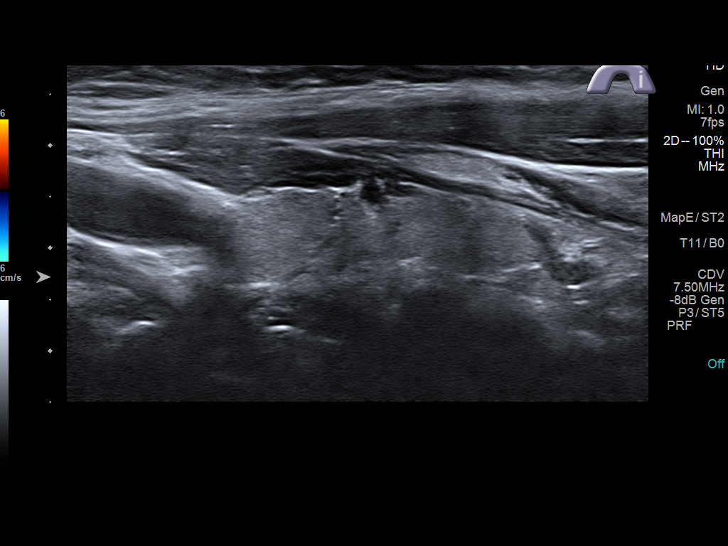
[im 37/56]
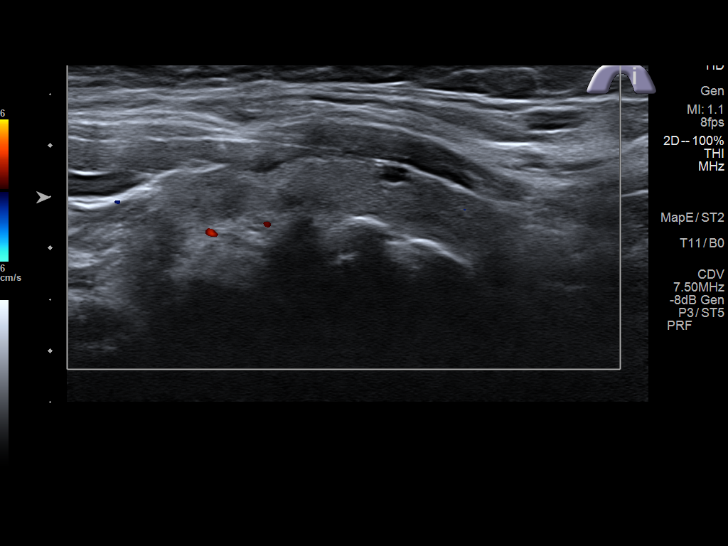
[im 42/56]
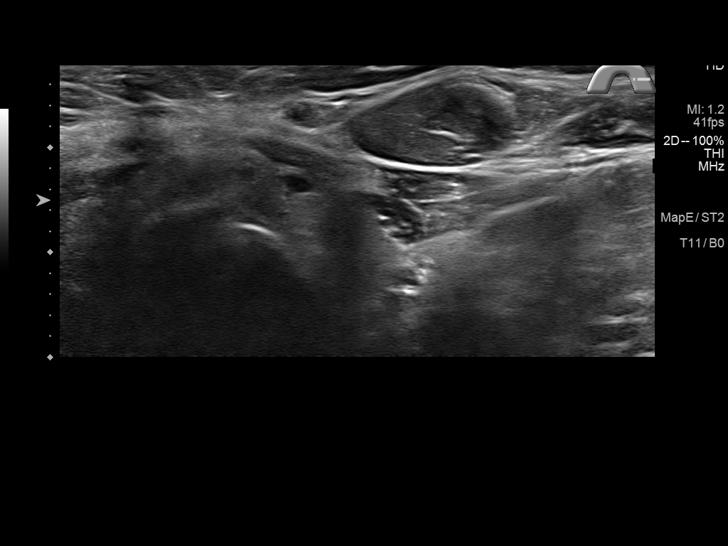
[im 46/56]
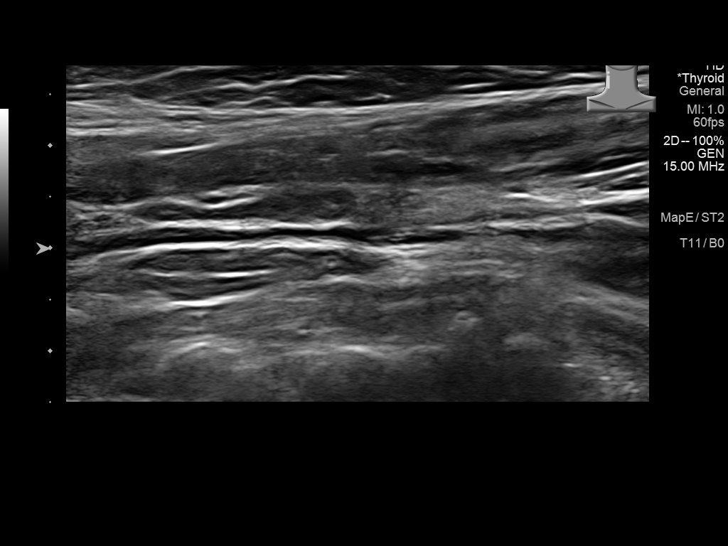
[im 51/56]
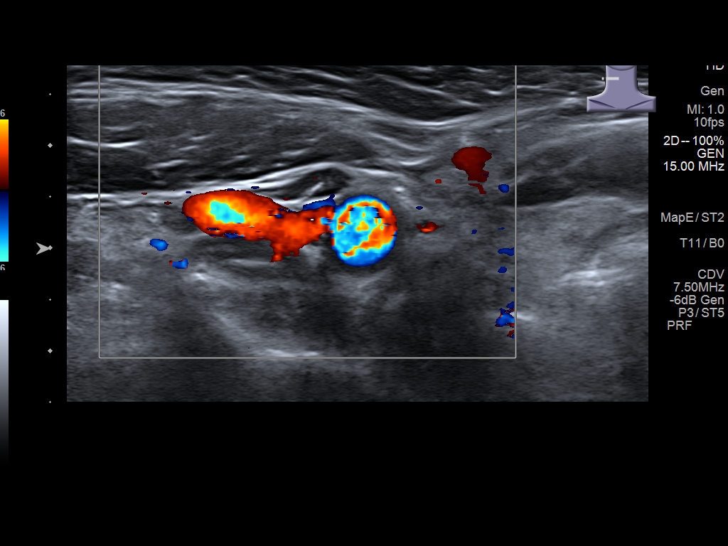
[im 56/56]
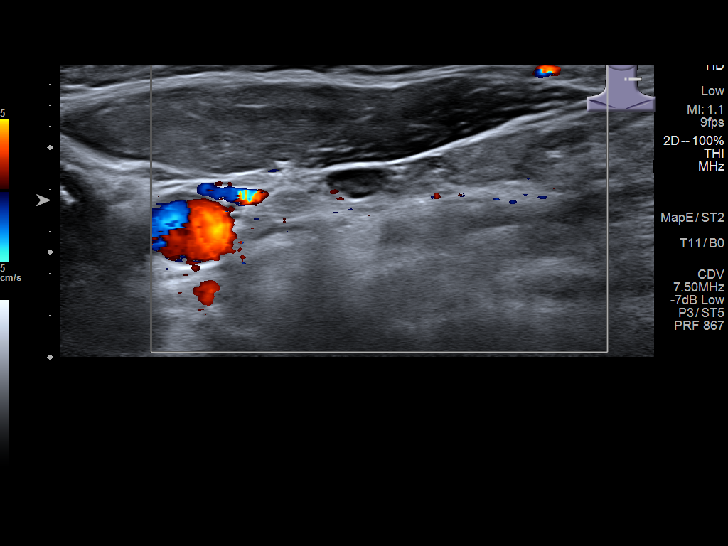

[14 of 25 positions shown; findings below may reference images not displayed]

FINDINGS: Parenchymal Echotexture: Mildly heterogeneous

Isthmus: 0.5 cm

Right lobe: 3.7 x 1.5 x 1.2 cm

Left lobe: 3.3 x 1.1 x 0.8 cm

_________________________________________________________

Estimated total number of nodules >/= 1 cm: 0

Number of spongiform nodules >/=  2 cm not described below (TR1): 0

Number of mixed cystic and solid nodules >/= 1.5 cm not described
below (TR2): 0

_________________________________________________________

3 mm cystic nodule noted in the left thyroid lobe.
IMPRESSION: No suspicious thyroid nodules.  No right lobe nodule identified.

The above is in keeping with the ACR TI-RADS recommendations - [HOSPITAL] 0558;[DATE].

## 2023-08-16 ENCOUNTER — Other Ambulatory Visit: Payer: Self-pay

## 2023-08-16 MED FILL — Rosuvastatin Calcium Tab 40 MG: ORAL | 90 days supply | Qty: 90 | Fill #2 | Status: AC

## 2023-09-05 NOTE — Progress Notes (Deleted)
 Cardiology Office Note  Date:  09/05/2023   ID:  Laura Gillespie, DOB May 16, 1961, MRN [id number]  PCP:  Bertell Satterfield, MD   No chief complaint on file.   HPI:  Laura Gillespie is a very pleasant 63 year old woman with  long history of smoking,  obesity,  hyperlipidemia,  Coronary artery disease hospital 01/07/2014 with chest pain, angina, non-STEMI. catheterization  showed severe distal RCA disease.  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, she is having some panic attacks, anxiety Husband died 08/16/20  Not on crestor , leg pain, did not restart the medication Back issue likely contributing to leg pain not the statin  Still smoking, would like to quit  Works in [place], designer, industrial/product  Eating less, stress  No regular exercise program or Denies chest pain concerning for angina  EKG personally reviewed by myself on todays visit Normal sinus rhythm rate 83 bpm right bundle branch block, left anterior fascicular block  Other past medical history reviewed Echocardiogram in the hospital was essentially normal 16-Aug-2014   Cardiac catheterization 2014-08-16 showed 99% distal RCA disease, down to 0% residual disease following the stent placement    PMH:   has a past medical history of Acute non-ST segment elevation myocardial infarction [facility]), Allergic rhinitis due to pollen, Anxiety disorder, unspecified, Atherosclerotic heart disease of native coronary artery without angina pectoris, Back pain, Bronchitis, not specified as acute or chronic, Chronic obstructive pulmonary disease with (acute) exacerbation (HCC), Coronary artery disease (12/2013), Essential (primary) hypertension, Hyperlipidemia, Hyperlipidemia, unspecified, Hypertension, Nicotine dependence, unspecified, uncomplicated, and Other hypersomnia.  PSH:    Past Surgical History:  Procedure Laterality Date   CARDIAC  CATHETERIZATION     CARPAL TUNNEL RELEASE     both hand   CHOLECYSTECTOMY     COLONOSCOPY N/A 05/24/2020   Procedure: COLONOSCOPY;  Surgeon: Golda Claudis PENNER, MD;  [facility]: [facility];  Service: Endoscopy;  Laterality: N/A;  1030   CORONARY ANGIOPLASTY  12/2013   stent placement to the distal RCA   LOBECTOMY Right 1991   LUNG BIOPSY     TONSILLECTOMY     TUBAL LIGATION      Current Outpatient Medications  Medication Sig Dispense Refill   acetaminophen (TYLENOL) 500 MG tablet Take 1,000 mg by mouth every 6 (six) hours as needed for moderate pain.      albuterol  (VENTOLIN  HFA) 108 (90 Base) MCG/ACT inhaler Inhale 1-2 puffs into the lungs every 4 (four) hours as needed. 20.1 g 10   amitriptyline  (ELAVIL ) 25 MG tablet Take one tablet by mouth at bedtime for 7 nights; then take 2 tablets by mouth at bedtime thereafter as needed. (Patient not taking: Reported on 11/11/2022) 60 tablet 10   amLODipine  (NORVASC ) 10 MG tablet Take 1 tablet (10 mg total) by mouth daily. 90 tablet 1   azithromycin  (ZITHROMAX ) 250 MG tablet Take as directed 6 each 0   benzonatate  (TESSALON ) 100 MG capsule Take 2 capsules (200 mg total) by mouth every 8 (eight) hours. (Patient not taking: Reported on 11/11/2022) 21 capsule 0   dextromethorphan-guaiFENesin (MUCINEX DM) 30-600 MG 12hr tablet Take 1 tablet by mouth 2 (two) times daily.     ibuprofen (ADVIL) 200 MG tablet Take 400 mg by mouth every 6 (six) hours as needed for moderate pain. (Patient not taking: Reported on 11/11/2022)     ibuprofen (ADVIL) 200 MG tablet Take  200 mg by mouth every 6 (six) hours as needed.     influenza vac split trivalent PF (FLULAVAL ) 0.5 ML injection Inject into the muscle. 0.5 mL 0   ipratropium-albuterol  (DUONEB) 0.5-2.5 (3) MG/3ML SOLN USE 1 VIAL ( ) VIA NEBULIZER 4 TIMES A DAY 270 mL 0   Naproxen (NAPROSYN PO) Take by mouth. (Patient not taking: Reported on 11/11/2022)     predniSONE  (DELTASONE ) 50 MG tablet Take 1 tablet by mouth  daily x 5 days 5 tablet 0   promethazine -dextromethorphan (PROMETHAZINE -DM) 6.25-15 MG/5ML syrup Take 5 mLs by mouth 4 (four) times daily as needed. (Patient not taking: Reported on 11/11/2022) 118 mL 0   rosuvastatin  (CRESTOR ) 40 MG tablet Take 1 tablet (40 mg total) by mouth daily. 90 tablet 3   No current facility-administered medications for this visit.     Allergies:   Acetaminophen, Morphine and codeine, Oxycodone hcl, Propoxyphene, and Percocet [oxycodone-acetaminophen]   Social History:  The patient  reports that she has been smoking cigarettes. She has a 17.5 pack-year smoking history. She has never used smokeless tobacco. She reports current alcohol use. She reports that she does not use drugs.   Family History:   family history includes Atrial fibrillation in her mother; Cancer in her father; Dementia in her paternal grandmother; Heart attack in her maternal grandfather and paternal grandfather; Heart attack (age of onset: 83) in her father; Heart disease in her father; Hyperlipidemia in her father and mother; Hypertension in her father, mother, and son; Lymphoma in her father; Other in her maternal grandmother.    Review of Systems: Review of Systems  Constitutional: Negative.   HENT: Negative.    Respiratory: Negative.    Cardiovascular: Negative.   Gastrointestinal: Negative.   Musculoskeletal: Negative.   Neurological: Negative.   Psychiatric/Behavioral: Negative.    All other systems reviewed and are negative.   PHYSICAL EXAM: VS:  There were no vitals taken for this visit. , BMI There is no height or weight on file to calculate BMI. Constitutional:  oriented to person, place, and time. No distress.  HENT:  Head: Grossly normal Eyes:  no discharge. No scleral icterus.  Neck: No JVD, no carotid bruits  Cardiovascular: Regular rate and rhythm, no murmurs appreciated Pulmonary/Chest: Clear to auscultation bilaterally, no wheezes or rails Abdominal: Soft.  no  distension.  no tenderness.  Musculoskeletal: Normal range of motion Neurological:  normal muscle tone. Coordination normal. No atrophy Skin: Skin warm and dry Psychiatric: normal affect, pleasant  Recent Labs: 03/05/2023: ALT 16; BUN 8; Creatinine, Ser 0.75; Hemoglobin 13.7; Platelets 294; Potassium 3.8; Sodium 135; TSH 1.611    Lipid Panel Lab Results  Component Value Date   CHOL 118 03/05/2023   HDL 54 03/05/2023   LDLCALC 45 03/05/2023   TRIG 93 03/05/2023      Wt Readings from Last 3 Encounters:  07/21/23 170 lb (77.1 kg)  11/19/22 200 lb (90.7 kg)  11/11/22 196 lb (88.9 kg)     ASSESSMENT AND PLAN:  Problem List Items Addressed This Visit   None   Shortness of breath Smoking cessation recommended, continued weight loss, walking program Weight is trending down We have prescribed Chantix   Smoker 1 pack/day 1 pack/day Discussed complications such as COPD, coronary disease She has declined both  Hyperlipidemia Recommend she restart her Crestor  Goal LDL less than 70  Morbid obesity Weight is trending down, eating less, dietary changes leading to great weight loss   Total encounter time more than 30  minutes  Greater than 50% was spent in counseling and coordination of care with the patient    Signed, Velinda Lunger, M.D., Ph.D. [facility] [place], [place] [phone]

## 2023-09-07 ENCOUNTER — Ambulatory Visit: Payer: 59 | Attending: Cardiovascular Disease | Admitting: Cardiovascular Disease

## 2023-09-07 DIAGNOSIS — I25118 Atherosclerotic heart disease of native coronary artery with other forms of angina pectoris: Secondary | ICD-10-CM

## 2023-09-07 DIAGNOSIS — R0602 Shortness of breath: Secondary | ICD-10-CM

## 2023-09-07 DIAGNOSIS — E782 Mixed hyperlipidemia: Secondary | ICD-10-CM

## 2023-09-07 DIAGNOSIS — J439 Emphysema, unspecified: Secondary | ICD-10-CM

## 2023-09-07 DIAGNOSIS — F17200 Nicotine dependence, unspecified, uncomplicated: Secondary | ICD-10-CM

## 2023-09-17 ENCOUNTER — Other Ambulatory Visit: Payer: Self-pay | Admitting: Internal Medicine

## 2023-09-17 ENCOUNTER — Ambulatory Visit
Admission: RE | Admit: 2023-09-17 | Discharge: 2023-09-17 | Disposition: A | Discharge status: Home / Self Care | Payer: 59 | Source: Ambulatory Visit | Attending: Internal Medicine | Admitting: Internal Medicine

## 2023-09-17 ENCOUNTER — Ambulatory Visit
Admission: RE | Admit: 2023-09-17 | Discharge: 2023-09-17 | Disposition: A | Discharge status: Home / Self Care | Payer: 59 | Attending: Internal Medicine | Admitting: Internal Medicine

## 2023-09-17 DIAGNOSIS — R053 Chronic cough: Secondary | ICD-10-CM | POA: Diagnosis not present

## 2023-10-08 ENCOUNTER — Other Ambulatory Visit: Payer: Self-pay

## 2023-10-09 ENCOUNTER — Other Ambulatory Visit: Payer: Self-pay

## 2023-10-12 ENCOUNTER — Other Ambulatory Visit: Payer: Self-pay

## 2023-10-12 NOTE — Progress Notes (Unsigned)
 Cardiology Office Note  Date:  10/13/2023   ID:  Laura Gillespie, DOB 1960/11/30, MRN [id number]  PCP:  Laura Nevins, MD   Chief Complaint  Patient presents with   12 month follow up     "Doing well."     HPI:  [name] is a 63 year old woman with  long history of smoking,  obesity,  hyperlipidemia,  Coronary artery disease hospital 01/07/2014 with chest pain, angina, non-STEMI. catheterization  showed severe distal RCA disease, Bare-metal stent  She presents  forfollow-up of her coronary artery disease  Last seen by myself in clinic 4/23 Works in urgent care, [place]  Continues to have stress at home, takes care of aging mother Husband died 10-23-19  On semiglutide, done over the computer 2.5 mg every week Weight down 90 pounds over the past several years  Exercise limited secondary to chronic back and hip pain Declining back surgery  Continues to smoke, previously 1 pack/day Tolerating Crestor 40 daily  Work reviewed Total cholesterol 118 LDL 45 A1c 5.7 Normal CMP  EKG personally reviewed by myself on todays visit EKG Interpretation Date/Time:  Tuesday October 13 2023 08:23:46 EST Ventricular Rate:  87 PR Interval:  142 QRS Duration:  146 QT Interval:  404 QTC Calculation: 486 R Axis:   -72  Text Interpretation: Normal sinus rhythm Right bundle branch block Left anterior fascicular block Bifascicular block When compared with ECG of 10-Sep-2022 22:17, No significant change was found Confirmed by Laura Gillespie [phone]) on 10/13/2023 8:28:39 AM    Other past medical history reviewed Echocardiogram in the hospital was essentially normal 10/22/2013   Cardiac catheterization 2013-10-22 showed 99% distal RCA disease, down to 0% residual disease following the stent placement    PMH:   has a past medical history of Acute non-ST segment elevation myocardial infarction [facility]), Allergic rhinitis due to pollen, Anxiety disorder, unspecified, Atherosclerotic heart disease of  native coronary artery without angina pectoris, Back pain, Bronchitis, not specified as acute or chronic, Chronic obstructive pulmonary disease with (acute) exacerbation (HCC), Coronary artery disease (12/2013), Essential (primary) hypertension, Hyperlipidemia, Hyperlipidemia, unspecified, Hypertension, Nicotine dependence, unspecified, uncomplicated, and Other hypersomnia.  PSH:    Past Surgical History:  Procedure Laterality Date   CARDIAC CATHETERIZATION     CARPAL TUNNEL RELEASE     both hand   CHOLECYSTECTOMY     COLONOSCOPY N/A 05/24/2020   Procedure: COLONOSCOPY;  Surgeon: Laura Hippo, MD;  [facility];  Service: Endoscopy;  Laterality: N/A;  1030   CORONARY ANGIOPLASTY  12/2013   stent placement to the distal RCA   LOBECTOMY Right 1991   LUNG BIOPSY     TONSILLECTOMY     TUBAL LIGATION      Current Outpatient Medications  Medication Sig Dispense Refill   acetaminophen (TYLENOL) 500 MG tablet Take 1,000 mg by mouth every 6 (six) hours as needed for moderate pain.      albuterol (VENTOLIN HFA) 108 (90 Base) MCG/ACT inhaler Inhale 1-2 puffs into the lungs every 4 (four) hours as needed. 20.1 g 10   amLODipine (NORVASC) 10 MG tablet Take 1 tablet (10 mg total) by mouth daily. 90 tablet 1   dextromethorphan-guaiFENesin (MUCINEX DM) 30-600 MG 12hr tablet Take 1 tablet by mouth 2 (two) times daily.     ibuprofen (ADVIL) 200 MG tablet Take 200 mg by mouth every 6 (six) hours as needed.     ipratropium-albuterol (DUONEB) 0.5-2.5 (3) MG/3ML SOLN USE 1 VIAL ( ) VIA NEBULIZER 4  TIMES A DAY 270 mL 0   rosuvastatin (CRESTOR) 40 MG tablet Take 1 tablet (40 mg total) by mouth daily. 90 tablet 3   amitriptyline (ELAVIL) 25 MG tablet Take one tablet by mouth at bedtime for 7 nights; then take 2 tablets by mouth at bedtime thereafter as needed. (Patient not taking: Reported on 10/13/2023) 60 tablet 10   influenza vac split trivalent PF (FLULAVAL) 0.5 ML injection Inject into the  muscle. (Patient not taking: Reported on 10/13/2023) 0.5 mL 0   promethazine-dextromethorphan (PROMETHAZINE-DM) 6.25-15 MG/5ML syrup Take 5 mLs by mouth 4 (four) times daily as needed. (Patient not taking: Reported on 10/13/2023) 118 mL 0   No current facility-administered medications for this visit.    Allergies:   Acetaminophen, Morphine and codeine, Oxycodone hcl, Propoxyphene, and Percocet [oxycodone-acetaminophen]   Social History:  The patient  reports that she has been smoking cigarettes. She has a 17.5 pack-year smoking history. She has never used smokeless tobacco. She reports current alcohol use. She reports that she does not use drugs.   Family History:   family history includes Atrial fibrillation in her mother; Cancer in her father; Dementia in her paternal grandmother; Heart attack in her maternal grandfather and paternal grandfather; Heart attack (age of onset: 45) in her father; Heart disease in her father; Hyperlipidemia in her father and mother; Hypertension in her father, mother, and son; Lymphoma in her father; Other in her maternal grandmother.    Review of Systems: Review of Systems  Constitutional: Negative.   HENT: Negative.    Respiratory: Negative.    Cardiovascular: Negative.   Gastrointestinal: Negative.   Musculoskeletal: Negative.   Neurological: Negative.   Psychiatric/Behavioral: Negative.    All other systems reviewed and are negative.   PHYSICAL EXAM: VS:  BP 130/62 (BP Location: Left Arm, Patient Position: Sitting, Cuff Size: Normal)   Pulse 87   Ht 5\' 3"  (1.6 m)   Wt 171 lb 6 oz (77.7 kg)   SpO2 98%   BMI 30.36 kg/m  , BMI Body mass index is 30.36 kg/m. Constitutional:  oriented to person, place, and time. No distress.  HENT:  Head: Grossly normal Eyes:  no discharge. No scleral icterus.  Neck: No JVD, no carotid bruits  Cardiovascular: Regular rate and rhythm, no murmurs appreciated Pulmonary/Chest: Clear to auscultation bilaterally, no  wheezes or rails Abdominal: Soft.  no distension.  no tenderness.  Musculoskeletal: Normal range of motion Neurological:  normal muscle tone. Coordination normal. No atrophy Skin: Skin warm and dry Psychiatric: normal affect, pleasant  Recent Labs: 03/05/2023: ALT 16; BUN 8; Creatinine, Ser 0.75; Hemoglobin 13.7; Platelets 294; Potassium 3.8; Sodium 135; TSH 1.611    Lipid Panel Lab Results  Component Value Date   CHOL 118 03/05/2023   HDL 54 03/05/2023   LDLCALC 45 03/05/2023   TRIG 93 03/05/2023    Wt Readings from Last 3 Encounters:  10/13/23 171 lb 6 oz (77.7 kg)  07/21/23 170 lb (77.1 kg)  11/19/22 200 lb (90.7 kg)     ASSESSMENT AND PLAN:  Problem List Items Addressed This Visit       Cardiology Problems   Coronary atherosclerosis of native coronary artery   Relevant Orders   EKG 12-Lead (Completed)   Hyperlipidemia     Other   Hx of emphysema (HCC)   Smoker   Myalgia   SOB (shortness of breath)   Relevant Orders   EKG 12-Lead (Completed)   Other Visit Diagnoses  Coronary artery disease of native artery of native heart with stable angina pectoris (HCC)    -  Primary   Relevant Orders   EKG 12-Lead (Completed)      Shortness of breath Smoking cessation recommended Weight down, on inhalers  Smoker 1 pack/day 1 pack/day Discussed complications such as COPD, coronary disease Discussed very strategies for smoking cessation  Hyperlipidemia Cholesterol down nicely on Crestor daily Numbers at goal  Morbid obesity On semiglutide compound Has had significant weight loss over the past several years A1c well-controlled, cholesterol at goal   Signed, Dossie Arbour, M.D., Ph.D. [facility] [place], [place] [phone]

## 2023-10-13 ENCOUNTER — Other Ambulatory Visit: Payer: Self-pay

## 2023-10-13 ENCOUNTER — Encounter: Payer: Self-pay | Admitting: Cardiovascular Disease

## 2023-10-13 ENCOUNTER — Ambulatory Visit: Payer: 59 | Attending: Cardiovascular Disease | Admitting: Cardiovascular Disease

## 2023-10-13 VITALS — BP 130/62 | HR 87 | Ht 63.0 in | Wt 171.4 lb

## 2023-10-13 DIAGNOSIS — F17200 Nicotine dependence, unspecified, uncomplicated: Secondary | ICD-10-CM

## 2023-10-13 DIAGNOSIS — I25118 Atherosclerotic heart disease of native coronary artery with other forms of angina pectoris: Secondary | ICD-10-CM | POA: Diagnosis not present

## 2023-10-13 DIAGNOSIS — J439 Emphysema, unspecified: Secondary | ICD-10-CM | POA: Diagnosis not present

## 2023-10-13 DIAGNOSIS — M791 Myalgia, unspecified site: Secondary | ICD-10-CM | POA: Diagnosis not present

## 2023-10-13 DIAGNOSIS — E782 Mixed hyperlipidemia: Secondary | ICD-10-CM | POA: Diagnosis not present

## 2023-10-13 DIAGNOSIS — R0602 Shortness of breath: Secondary | ICD-10-CM | POA: Diagnosis not present

## 2023-10-13 MED ORDER — IPRATROPIUM-ALBUTEROL 0.5-2.5 (3) MG/3ML IN SOLN
3.0000 mL | Freq: Four times a day (QID) | RESPIRATORY_TRACT | 2 refills | Status: AC
Start: 2023-10-13 — End: ?
  Filled 2023-10-13 – 2024-02-19 (×2): qty 270, 23d supply, fill #0

## 2023-10-13 NOTE — Patient Instructions (Signed)

## 2023-11-10 ENCOUNTER — Other Ambulatory Visit: Payer: Self-pay

## 2023-11-10 MED ORDER — AMLODIPINE BESYLATE 10 MG PO TABS
10.0000 mg | ORAL_TABLET | Freq: Every day | ORAL | 1 refills | Status: DC
Start: 2023-11-10 — End: 2024-05-17
  Filled 2023-11-10: qty 90, 90d supply, fill #0
  Filled 2024-02-19: qty 90, 90d supply, fill #1

## 2023-11-10 MED FILL — Rosuvastatin Calcium Tab 40 MG: ORAL | 90 days supply | Qty: 90 | Fill #3 | Status: AC

## 2023-11-21 IMAGING — MR MR LUMBAR SPINE W/O CM
4 of 5 series · 24 of 48 positions shown · non-contrast
Comparison: MR lumbar 04/21/2019; X-ray lumbar 04/05/2019.

CLINICAL DATA: Ongoing lower back pain with bilateral weakness and
notes trouble walking for 1 year.

EXAM:
MRI LUMBAR SPINE WITHOUT CONTRAST
TECHNIQUE: Multiplanar, multisequence MR imaging of the lumbar spine was
performed. No intravenous contrast was administered.

[Series 2: T2 · sagittal · 4.0mm · 0.81mm/px · 6 of 17 slices shown (1 of 2)]
[im 1/17]
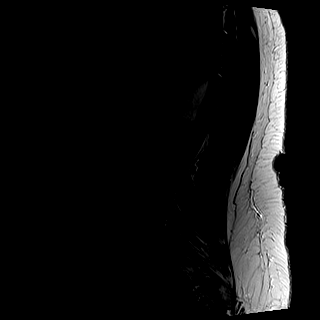
[im 4/17]
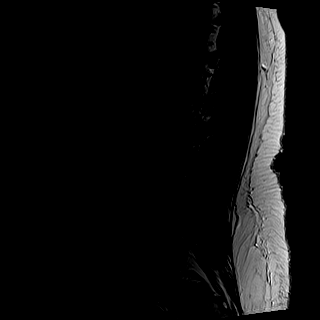
[im 7/17]
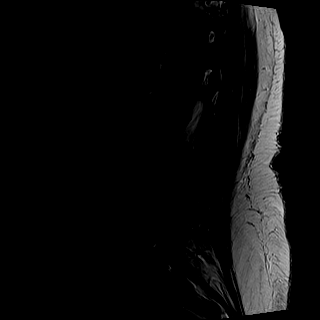
[im 10/17]
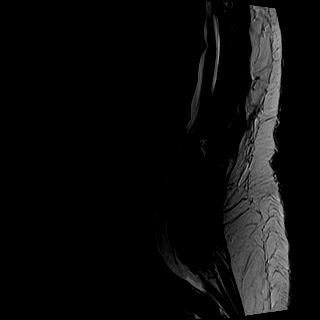
[im 13/17]
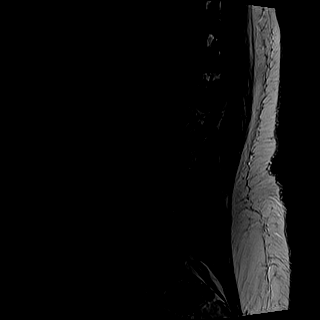
[im 17/17]
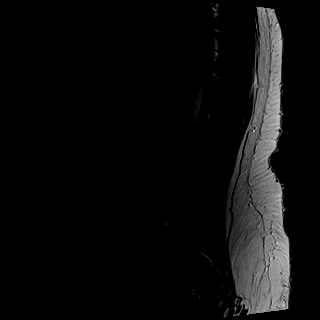

[Series 3: T1 · sagittal · 4.0mm · 0.41mm/px · 7 of 17 slices shown (1 of 2)]
[im 1/17]
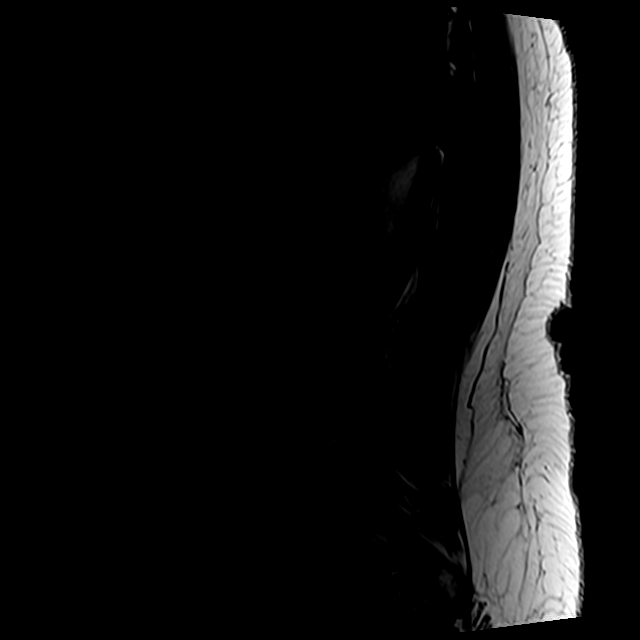
[im 3/17]
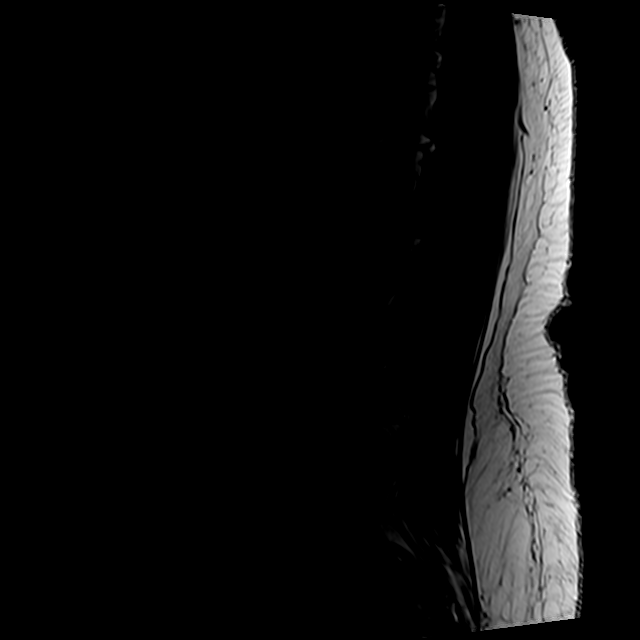
[im 6/17]
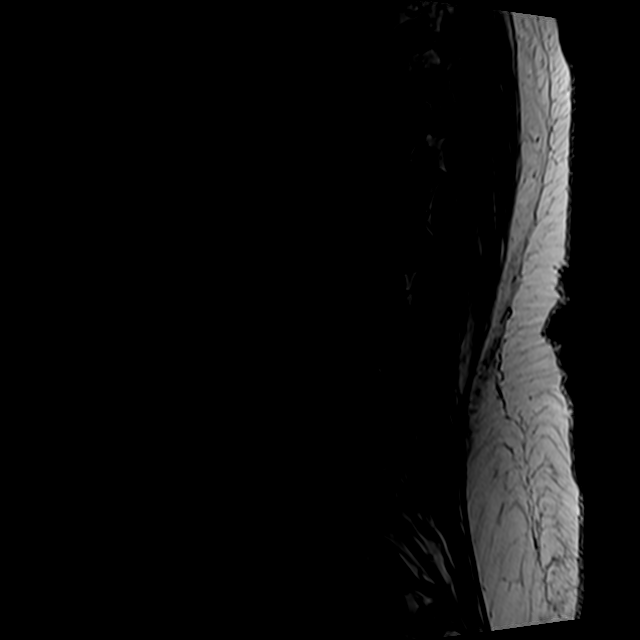
[im 9/17]
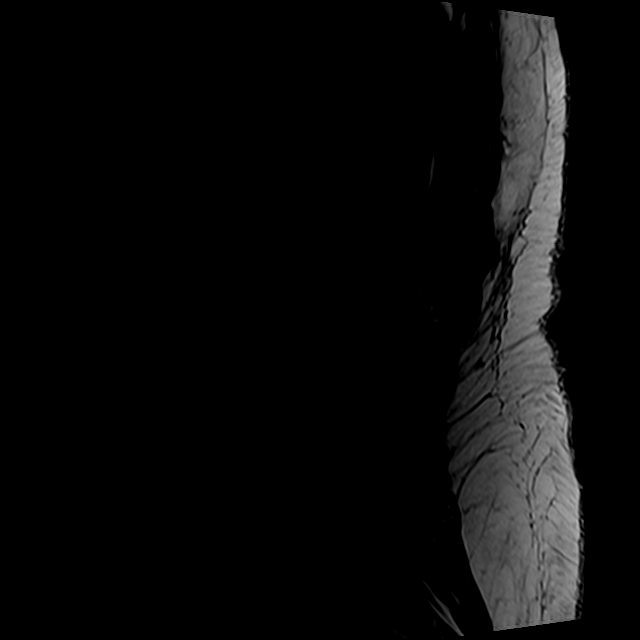
[im 11/17]
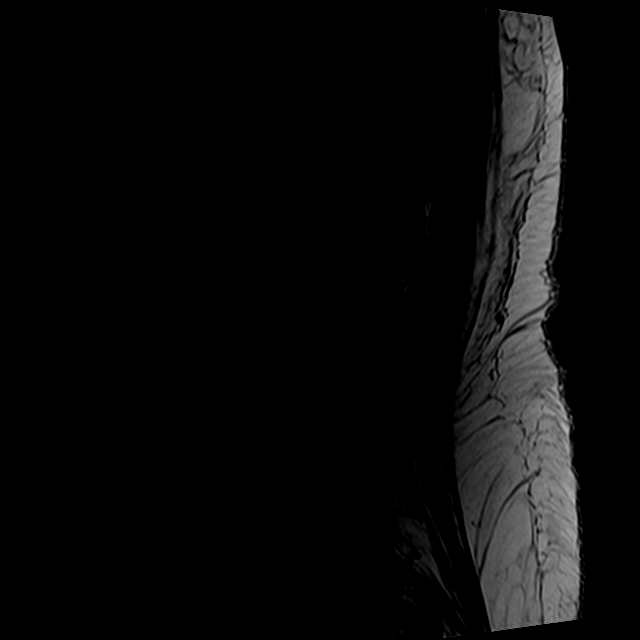
[im 14/17]
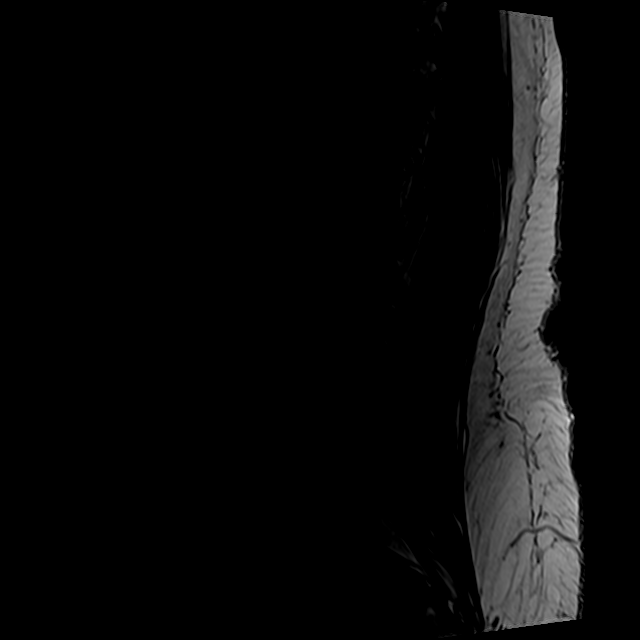
[im 17/17]
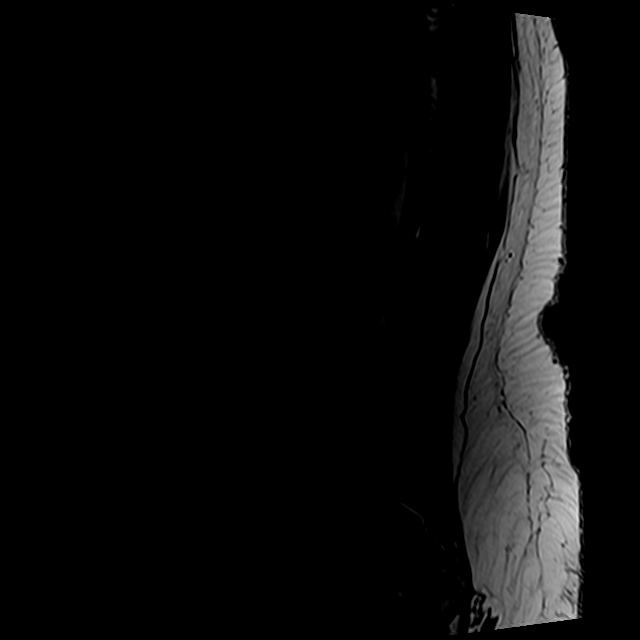

[Series 5: T2 · axial · 4.0mm · 0.78mm/px · z∈[-61,+150]mm · 8 of 34 slices shown (2 of 2)]
[im 1/34]
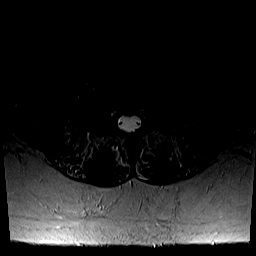
[im 6/34]
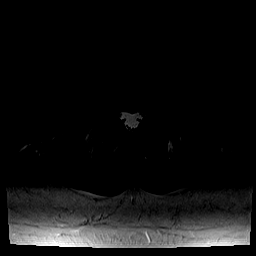
[im 11/34]
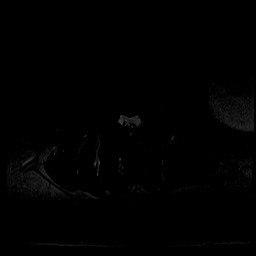
[im 16/34]
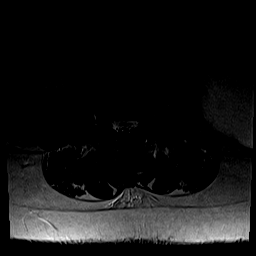
[im 18/34]
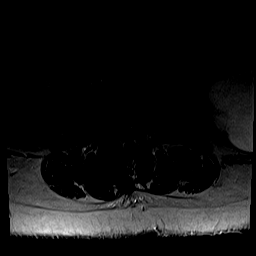
[im 23/34]
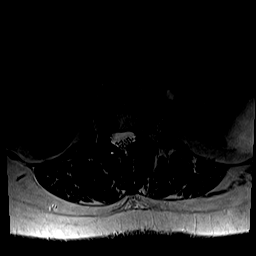
[im 28/34]
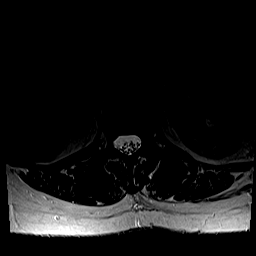
[im 34/34]
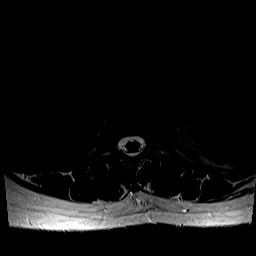

[Series 6: T1 · axial · 4.0mm · 0.39mm/px · z∈[-5,+120]mm · 3 of 34 slices shown (2 of 2)]
[im 6/34]
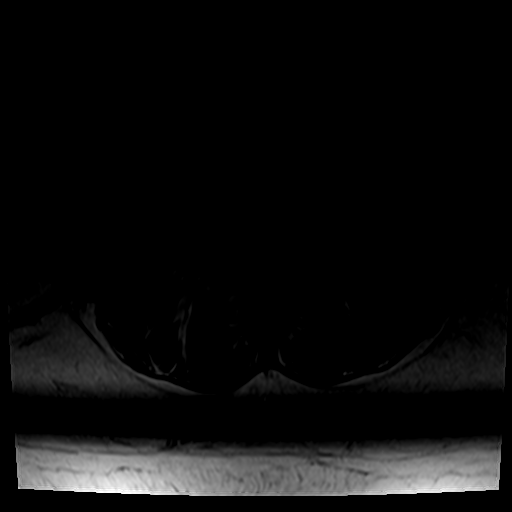
[im 18/34]
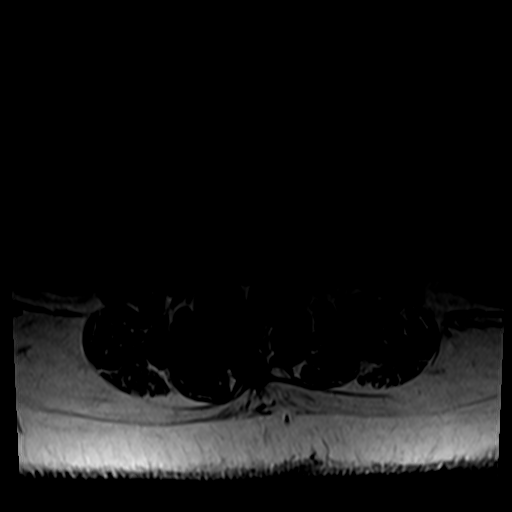
[im 28/34]
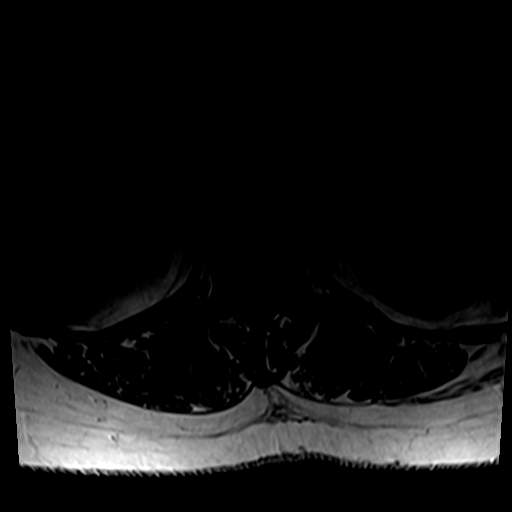

[24 of 48 positions shown; findings below may reference images not displayed]

FINDINGS: Segmentation: Standard.

Alignment: There is 3 mm retrolisthesis L2 on L3, progressed. Slight
scoliotic curvature.

Vertebrae: Progressive discogenic bone marrow edema centered at the
L2-3 level and left L2-3 facet joint. No acute fracture. No evidence
of discitis. No suspicious marrow replacing bone lesion.

Conus medullaris and cauda equina: Conus extends to the L1 level.
Conus and cauda equina appear normal.

Paraspinal and other soft tissues: No acute findings. Partially
imaged left renal cyst.

Disc levels:

T11-T12: Sagittal sequences only. Shallow central disc protrusion
without evidence of significant stenosis. Unchanged.

T12-L1: Unremarkable.

L1-L2: Minimal annular disc bulge without foraminal or canal
stenosis. Unchanged.

L2-L3: Mild diffuse disc bulge with small caudally extending central
disc protrusion. Mild facet arthropathy. Findings result in
mild-to-moderate canal stenosis with moderate left and mild right
foraminal stenosis. Findings progressed from prior.

L3-L4: Mild diffuse disc bulge with mild bilateral facet
arthropathy. Small left facet joint effusion. Mild bilateral
foraminal stenosis. Borderline-mild canal stenosis. Unchanged.

L4-L5: Minimal annular disc bulge. Bilateral facet arthropathy. Mild
bilateral foraminal stenosis. No canal stenosis. Unchanged.

L5-S1: Shallow central disc protrusion. Mild-moderate bilateral
facet arthropathy. Mild bilateral foraminal stenosis, right worse
than left. No canal stenosis. Unchanged.
IMPRESSION: Multilevel lumbar spondylosis, as described above. Findings are most
pronounced at the L2-3 level where there is mild-to-moderate canal
stenosis with moderate left and mild right foraminal stenosis.
Findings have progressed from prior study.

## 2023-12-01 DIAGNOSIS — D485 Neoplasm of uncertain behavior of skin: Secondary | ICD-10-CM | POA: Diagnosis not present

## 2023-12-01 DIAGNOSIS — L2989 Other pruritus: Secondary | ICD-10-CM | POA: Diagnosis not present

## 2023-12-01 DIAGNOSIS — L538 Other specified erythematous conditions: Secondary | ICD-10-CM | POA: Diagnosis not present

## 2023-12-01 DIAGNOSIS — L578 Other skin changes due to chronic exposure to nonionizing radiation: Secondary | ICD-10-CM | POA: Diagnosis not present

## 2023-12-01 DIAGNOSIS — L820 Inflamed seborrheic keratosis: Secondary | ICD-10-CM | POA: Diagnosis not present

## 2023-12-01 DIAGNOSIS — D040 Carcinoma in situ of skin of lip: Secondary | ICD-10-CM | POA: Diagnosis not present

## 2023-12-28 ENCOUNTER — Ambulatory Visit
Admission: EM | Admit: 2023-12-28 | Discharge: 2023-12-28 | Disposition: A | Discharge status: Home / Self Care | Attending: Physician Assistant | Admitting: Physician Assistant

## 2023-12-28 DIAGNOSIS — J441 Chronic obstructive pulmonary disease with (acute) exacerbation: Secondary | ICD-10-CM

## 2023-12-28 DIAGNOSIS — R051 Acute cough: Secondary | ICD-10-CM | POA: Diagnosis not present

## 2023-12-28 DIAGNOSIS — Z720 Tobacco use: Secondary | ICD-10-CM

## 2023-12-28 MED ORDER — DEXAMETHASONE SODIUM PHOSPHATE 10 MG/ML IJ SOLN
10.0000 mg | Freq: Once | INTRAMUSCULAR | Status: AC
  Administered 2023-12-28: 10 mg via INTRAMUSCULAR

## 2023-12-28 MED ORDER — PREDNISONE 10 MG PO TABS: ORAL_TABLET | ORAL | 0 refills | Status: AC

## 2023-12-28 MED ORDER — DOXYCYCLINE HYCLATE 100 MG PO CAPS: 100.0000 mg | ORAL_CAPSULE | Freq: Two times a day (BID) | ORAL | 0 refills | Status: AC

## 2023-12-28 NOTE — ED Provider Notes (Signed)
 [facility]    CSN: [id number] Arrival date & time: 12/28/23  1531      History   Chief Complaint Chief Complaint  Patient presents with   Cough   Shortness of Breath   Wheezing    HPI Laura Gillespie is a 63 y.o. female with history of COPD, CAD, hypertension, hyperlipidemia, anxiety.  Patient presents today for 3-day history of fatigue, cough, congestion, wheezing with shortness of breath.  Patient reports nasal drainage is clear that she is coughing up yellowish-brown sputum.  She says this is not necessarily out of the ordinary but it is more productive than normal.  Has been using inhalers at home and taking Allegra-D and Mucinex without relief.  Patient requests to have a corticosteroid injection and corticosteroid taper.  Denies any known COVID or flu exposure and declines testing.  HPI  Past Medical History:  Diagnosis Date   Acute non-ST segment elevation myocardial infarction [facility])    Allergic rhinitis due to pollen    Anxiety disorder, unspecified    Atherosclerotic heart disease of native coronary artery without angina pectoris    Back pain    Bronchitis, not specified as acute or chronic    Chronic obstructive pulmonary disease with (acute) exacerbation (HCC)    Coronary artery disease 12/2013   s/p bare metal stent to distal RCA   Essential (primary) hypertension    Hyperlipidemia    Hyperlipidemia, unspecified    Hypertension    Nicotine dependence, unspecified, uncomplicated    Other hypersomnia     Patient Active Problem List   Diagnosis Date Noted   Hx of emphysema (HCC) 07/21/2023   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) 11/11/2022   Chronic radicular lumbar pain 11/11/2022   Encounter for screening fecal occult blood testing 04/22/2022   Encounter for gynecological examination with Papanicolaou smear of cervix 04/22/2022   Morbid obesity (HCC) 10/29/2015   Myalgia 07/17/2014   SOB (shortness of breath)  07/17/2014   S/P right coronary artery (RCA) stent placement 01/17/2014   Coronary atherosclerosis of native coronary artery 01/17/2014   NSTEMI (non-ST elevated myocardial infarction) (HCC) 01/17/2014   Hyperlipidemia 01/17/2014   Smoker 01/17/2014   Viral URI with cough 01/17/2014    Past Surgical History:  Procedure Laterality Date   CARDIAC CATHETERIZATION     CARPAL TUNNEL RELEASE     both hand   CHOLECYSTECTOMY     COLONOSCOPY N/A 05/24/2020   Procedure: COLONOSCOPY;  Surgeon: Ruby Corporal, MD;  [facility];  Service: Endoscopy;  Laterality: N/A;  1030   CORONARY ANGIOPLASTY  12/2013   stent placement to the distal RCA   LOBECTOMY Right 1991   LUNG BIOPSY     TONSILLECTOMY     TUBAL LIGATION      OB History     Gravida  3   Para  2   Term  2   Preterm      AB  1   Living  2      SAB  1   IAB      Ectopic      Multiple      Live Births  2            Home Medications    Prior to Admission medications   Medication Sig Start Date End Date Taking? Authorizing Provider  acetaminophen (TYLENOL) 500 MG tablet Take 1,000 mg by mouth every 6 (six) hours as needed  for moderate pain.    Yes [provider]  albuterol  (VENTOLIN  HFA) 108 (90 Base) MCG/ACT inhaler Inhale 1-2 puffs into the lungs every 4 (four) hours as needed. 03/23/23  Yes   amLODipine  (NORVASC ) 10 MG tablet Take 1 tablet (10 mg total) by mouth daily. 11/10/23  Yes   dextromethorphan-guaiFENesin (MUCINEX DM) 30-600 MG 12hr tablet Take 1 tablet by mouth 2 (two) times daily.   Yes [provider]  doxycycline  (VIBRAMYCIN ) 100 MG capsule Take 1 capsule (100 mg total) by mouth 2 (two) times daily for 7 days. 12/28/23 01/04/24 Yes Nancy Axon B, PA-C  ibuprofen (ADVIL) 200 MG tablet Take 200 mg by mouth every 6 (six) hours as needed.   Yes [provider]  ipratropium-albuterol  (DUONEB) 0.5-2.5 (3) MG/3ML SOLN USE 1 VIAL ( ) VIA NEBULIZER 4 TIMES A DAY  02/11/22 12/28/23 Yes   ipratropium-albuterol  (DUONEB) 0.5-2.5 (3) MG/3ML SOLN Take 3 mLs (1 vial) by nebulization 4 times daily. 10/13/23  Yes   predniSONE  (DELTASONE ) 10 MG tablet Take 6 tabs on day 1 and decrease by 1 tablet daily to complete 12/28/23  Yes Nancy Axon B, PA-C  rosuvastatin  (CRESTOR ) 40 MG tablet Take 1 tablet (40 mg total) by mouth daily. 01/13/23  Yes Gollan, Timothy J, MD  amitriptyline  (ELAVIL ) 25 MG tablet Take one tablet by mouth at bedtime for 7 nights; then take 2 tablets by mouth at bedtime thereafter as needed. Patient not taking: Reported on 11/11/2022 01/23/22     influenza vac split trivalent PF (FLULAVAL) 0.5 ML injection Inject into the muscle. Patient not taking: Reported on 10/13/2023 06/11/23   Liane Redman, MD  promethazine -dextromethorphan (PROMETHAZINE -DM) 6.25-15 MG/5ML syrup Take 5 mLs by mouth 4 (four) times daily as needed. Patient not taking: Reported on 11/11/2022 10/18/22   Kent Pear, NP    Family History Family History  Problem Relation Age of Onset   Heart attack Paternal Grandfather    Dementia Paternal Grandmother    Other Maternal Grandmother        brain tumor   Heart attack Maternal Grandfather    Cancer Father        lung   Hypertension Father    Hyperlipidemia Father    Heart disease Father    Heart attack Father 53   Lymphoma Father    Hypertension Mother    Hyperlipidemia Mother    Atrial fibrillation Mother    Hypertension Son     Social History Social History   Tobacco Use   Smoking status: Every Day    Current packs/day: 0.50    Average packs/day: 0.5 packs/day for 35.0 years (17.5 ttl pk-yrs)    Types: Cigarettes   Smokeless tobacco: Never  Vaping Use   Vaping status: Former   Quit date: 07/27/2023  Substance Use Topics   Alcohol use: Yes   Drug use: No     Allergies   Acetaminophen, Morphine and codeine, Oxycodone hcl, Propoxyphene, and Percocet [oxycodone-acetaminophen]   Review of Systems Review of  Systems  Constitutional:  Positive for fatigue. Negative for chills, diaphoresis and fever.  HENT:  Positive for rhinorrhea. Negative for congestion, ear pain, sinus pressure, sinus pain and sore throat.   Respiratory:  Positive for cough, chest tightness, shortness of breath and wheezing.   Cardiovascular:  Negative for chest pain.  Gastrointestinal:  Negative for abdominal pain, nausea and vomiting.  Musculoskeletal:  Negative for arthralgias and myalgias.  Skin:  Negative for rash.  Neurological:  Negative for weakness  and headaches.  Hematological:  Negative for adenopathy.     Physical Exam Triage Vital Signs ED Triage Vitals  Encounter Vitals Group     BP 12/28/23 1624 127/74     Systolic BP Percentile --      Diastolic BP Percentile --      Pulse Rate 12/28/23 1624 90     Resp 12/28/23 1624 16     Temp 12/28/23 1624 98.8 F (37.1 C)     Temp Source 12/28/23 1624 Oral     SpO2 12/28/23 1624 95 %     Weight 12/28/23 1623 168 lb (76.2 kg)     Height 12/28/23 1623 5\' 3"  (1.6 m)     Head Circumference --      Peak Flow --      Pain Score 12/28/23 1630 4     Pain Loc --      Pain Education --      Exclude from Growth Chart --    No data found.  Updated Vital Signs BP 127/74 (BP Location: Right Arm)   Pulse 90   Temp 98.8 F (37.1 C) (Oral)   Resp 16   Ht 5\' 3"  (1.6 m)   Wt 168 lb (76.2 kg)   SpO2 95%   BMI 29.76 kg/m      Physical Exam Vitals and nursing note reviewed.  Constitutional:      General: She is not in acute distress.    Appearance: Normal appearance. She is not ill-appearing or toxic-appearing.  HENT:     Head: Normocephalic and atraumatic.     Nose: Congestion present.     Mouth/Throat:     Mouth: Mucous membranes are moist.     Pharynx: Oropharynx is clear.  Eyes:     General: No scleral icterus.       Right eye: No discharge.        Left eye: No discharge.     Conjunctiva/sclera: Conjunctivae normal.  Cardiovascular:     Rate and  Rhythm: Normal rate and regular rhythm.     Heart sounds: Normal heart sounds.  Pulmonary:     Effort: Pulmonary effort is normal. No respiratory distress.     Breath sounds: Wheezing and rhonchi present.  Musculoskeletal:     Cervical back: Neck supple.  Skin:    General: Skin is dry.  Neurological:     General: No focal deficit present.     Mental Status: She is alert. Mental status is at baseline.     Motor: No weakness.     Gait: Gait normal.  Psychiatric:        Mood and Affect: Mood normal.        Behavior: Behavior normal.      UC Treatments / Results  Labs (all labs ordered are listed, but only abnormal results are displayed) Labs Reviewed - No data to display  EKG   Radiology No results found.  Procedures Procedures (including critical care time)  Medications Ordered in UC Medications  dexamethasone  (DECADRON ) injection 10 mg (10 mg Intramuscular Given 12/28/23 1652)    Initial Impression / Assessment and Plan / UC Course  I have reviewed the triage vital signs and the nursing notes.  Pertinent labs & imaging results that were available during my care of the patient were reviewed by me and considered in my medical decision making (see chart for details).   63 year old female with history of COPD and continued tobacco abuse presents for 3-day history  of productive cough, fatigue, wheezing and shortness of breath.  Denies fever.  No known COVID or flu exposure declines testing.  Has been taking Allegra-D, Mucinex and using inhalers at home.  Vital stable and normal.  Overall well-appearing.  No acute distress.  Coughs frequently.  On exam has diffuse wheezing and rhonchi throughout all lung fields.    Patient declines nebulizer treatment.  Patient was given 10 mg IM dexamethasone .  Sent prednisone  taper to pharmacy for COPD exacerbation.  Also sent doxycycline .  Encouraged to continue taking Mucinex Allegra-D, rest and fluids.  Continue inhalers.  Thoroughly  reviewed return and ED precautions.  Reevaluated   Final Clinical Impressions(s) / UC Diagnoses   Final diagnoses:  COPD exacerbation (HCC)  Acute cough  Tobacco abuse     Discharge Instructions      - You were given an injection of the corticosteroid.  Start the prednisone  taper tomorrow. - Continue Mucinex and Allegra-D, rest and fluids and inhalers at home. - I sent antibiotics to pharmacy as well. - If you develop fever, acute worsening cough, increased breathing problem please return for evaluation and consideration of an x-ray.     ED Prescriptions     Medication Sig Dispense Auth. Provider   doxycycline  (VIBRAMYCIN ) 100 MG capsule Take 1 capsule (100 mg total) by mouth 2 (two) times daily for 7 days. 14 capsule Nancy Axon B, PA-C   predniSONE  (DELTASONE ) 10 MG tablet Take 6 tabs on day 1 and decrease by 1 tablet daily to complete 21 tablet Floydene Hy, PA-C      PDMP not reviewed this encounter.   Floydene Hy, PA-C 12/28/23 [id number]

## 2023-12-28 NOTE — ED Triage Notes (Signed)
 Pt c/o cough,sob & wheezing x3 days. Hx of COPD & bronchitis. Has tried coricidin & mucinex w/o relief.

## 2023-12-28 NOTE — Discharge Instructions (Signed)
-   You were given an injection of the corticosteroid.  Start the prednisone  taper tomorrow. - Continue Mucinex and Allegra-D, rest and fluids and inhalers at home. - I sent antibiotics to pharmacy as well. - If you develop fever, acute worsening cough, increased breathing problem please return for evaluation and consideration of an x-ray.

## 2024-01-26 ENCOUNTER — Other Ambulatory Visit: Payer: Self-pay

## 2024-01-28 ENCOUNTER — Other Ambulatory Visit: Payer: Self-pay

## 2024-01-28 DIAGNOSIS — L570 Actinic keratosis: Secondary | ICD-10-CM | POA: Diagnosis not present

## 2024-01-28 DIAGNOSIS — D1801 Hemangioma of skin and subcutaneous tissue: Secondary | ICD-10-CM | POA: Diagnosis not present

## 2024-01-28 DIAGNOSIS — L821 Other seborrheic keratosis: Secondary | ICD-10-CM | POA: Diagnosis not present

## 2024-01-28 DIAGNOSIS — D040 Carcinoma in situ of skin of lip: Secondary | ICD-10-CM | POA: Diagnosis not present

## 2024-01-28 DIAGNOSIS — L820 Inflamed seborrheic keratosis: Secondary | ICD-10-CM | POA: Diagnosis not present

## 2024-01-28 DIAGNOSIS — L814 Other melanin hyperpigmentation: Secondary | ICD-10-CM | POA: Diagnosis not present

## 2024-01-28 DIAGNOSIS — L578 Other skin changes due to chronic exposure to nonionizing radiation: Secondary | ICD-10-CM | POA: Diagnosis not present

## 2024-01-28 MED ORDER — TRIAMCINOLONE ACETONIDE 0.1 % EX CREA
TOPICAL_CREAM | CUTANEOUS | 2 refills | Status: AC
  Filled 2024-01-28: qty 45, 30d supply, fill #0

## 2024-02-19 ENCOUNTER — Other Ambulatory Visit: Payer: Self-pay | Admitting: Cardiovascular Disease

## 2024-02-19 ENCOUNTER — Other Ambulatory Visit: Payer: Self-pay

## 2024-02-19 MED FILL — Rosuvastatin Calcium Tab 40 MG: ORAL | 90 days supply | Qty: 90 | Fill #0 | Status: AC

## 2024-03-25 ENCOUNTER — Other Ambulatory Visit: Payer: Self-pay

## 2024-03-25 MED ORDER — ALBUTEROL SULFATE HFA 108 (90 BASE) MCG/ACT IN AERS
2.0000 | INHALATION_SPRAY | RESPIRATORY_TRACT | 3 refills | Status: AC | PRN
  Filled 2024-03-25: qty 20.1, 90d supply, fill #0
  Filled 2024-03-28: qty 20.1, 48d supply, fill #0
  Filled 2024-05-17: qty 20.1, 48d supply, fill #1

## 2024-03-25 MED ORDER — THEO-24 200 MG PO CP24
200.0000 mg | ORAL_CAPSULE | Freq: Every day | ORAL | 3 refills | Status: AC
  Filled 2024-03-25: qty 90, 90d supply, fill #0

## 2024-03-25 MED ORDER — MOUNJARO 2.5 MG/0.5ML ~~LOC~~ SOAJ
2.5000 mg | SUBCUTANEOUS | 2 refills | Status: AC
  Filled 2024-03-25 – 2024-03-28 (×2): qty 2, 28d supply, fill #0

## 2024-03-28 ENCOUNTER — Other Ambulatory Visit: Payer: Self-pay

## 2024-03-28 ENCOUNTER — Other Ambulatory Visit: Payer: Self-pay | Admitting: Internal Medicine

## 2024-03-28 DIAGNOSIS — Z122 Encounter for screening for malignant neoplasm of respiratory organs: Secondary | ICD-10-CM

## 2024-03-30 ENCOUNTER — Other Ambulatory Visit
Admission: RE | Admit: 2024-03-30 | Discharge: 2024-03-30 | Disposition: A | Discharge status: Home / Self Care | Attending: Internal Medicine | Admitting: Internal Medicine

## 2024-03-30 DIAGNOSIS — Z0001 Encounter for general adult medical examination with abnormal findings: Secondary | ICD-10-CM | POA: Diagnosis not present

## 2024-03-30 DIAGNOSIS — Z9229 Personal history of other drug therapy: Secondary | ICD-10-CM | POA: Insufficient documentation

## 2024-03-30 LAB — COMPREHENSIVE METABOLIC PANEL WITH GFR
ALT: 30 U/L (ref 0–44)
AST: 31 U/L (ref 15–41)
Albumin: 4.4 g/dL (ref 3.5–5.0)
Alkaline Phosphatase: 83 U/L (ref 38–126)
Anion gap: 10 (ref 5–15)
BUN: 11 mg/dL (ref 8–23)
CO2: 25 mmol/L (ref 22–32)
Calcium: 9.5 mg/dL (ref 8.9–10.3)
Chloride: 103 mmol/L (ref 98–111)
Creatinine, Ser: 0.67 mg/dL (ref 0.44–1.00)
GFR, Estimated: >60 mL/min (ref >60)
Glucose, Bld: 119 mg/dL — ABNORMAL HIGH (ref 70–99)
Potassium: 3.6 mmol/L (ref 3.5–5.1)
Sodium: 138 mmol/L (ref 135–145)
Total Bilirubin: 0.7 mg/dL (ref 0.0–1.2)
Total Protein: 7.3 g/dL (ref 6.5–8.1)

## 2024-03-30 LAB — CBC WITH DIFFERENTIAL/PLATELET
Abs Immature Granulocytes: 0.01 K/uL (ref 0.00–0.07)
Basophils Absolute: 0 K/uL (ref 0.0–0.1)
Basophils Relative: 1 %
Eosinophils Absolute: 0.1 K/uL (ref 0.0–0.5)
Eosinophils Relative: 2 %
HCT: 39.5 % (ref 36.0–46.0)
Hemoglobin: 13.6 g/dL (ref 12.0–15.0)
Immature Granulocytes: 0 %
Lymphocytes Relative: 29 %
Lymphs Abs: 1.9 K/uL (ref 0.7–4.0)
MCH: 30.1 pg (ref 26.0–34.0)
MCHC: 34.4 g/dL (ref 30.0–36.0)
MCV: 87.4 fL (ref 80.0–100.0)
Monocytes Absolute: 0.4 K/uL (ref 0.1–1.0)
Monocytes Relative: 6 %
Neutro Abs: 3.9 K/uL (ref 1.7–7.7)
Neutrophils Relative %: 62 %
Platelets: 289 K/uL (ref 150–400)
RBC: 4.52 MIL/uL (ref 3.87–5.11)
RDW: 12.9 % (ref 11.5–15.5)
WBC: 6.3 K/uL (ref 4.0–10.5)
nRBC: 0 % (ref 0.0–0.2)

## 2024-03-30 LAB — LIPID PANEL
Cholesterol: 129 mg/dL (ref 0–200)
HDL: 61 mg/dL (ref >40)
LDL Cholesterol: 45 mg/dL (ref 0–99)
Total CHOL/HDL Ratio: 2.1 ratio
Triglycerides: 113 mg/dL (ref <150)
VLDL: 23 mg/dL (ref 0–40)

## 2024-03-30 LAB — VITAMIN B12: Vitamin B-12: 517 pg/mL (ref 180–914)

## 2024-03-30 LAB — FOLATE: Folate: 16.9 ng/mL (ref >5.9)

## 2024-03-30 LAB — TSH: TSH: 1.9 u[IU]/mL (ref 0.350–4.500)

## 2024-03-30 LAB — VITAMIN D 25 HYDROXY (VIT D DEFICIENCY, FRACTURES): Vit D, 25-Hydroxy: 34.81 ng/mL (ref 30–100)

## 2024-03-31 ENCOUNTER — Other Ambulatory Visit: Payer: Self-pay

## 2024-03-31 LAB — HEMOGLOBIN A1C
Hgb A1c MFr Bld: 5.4 % (ref 4.8–5.6)
Mean Plasma Glucose: 108 mg/dL

## 2024-04-05 ENCOUNTER — Ambulatory Visit
Admission: RE | Admit: 2024-04-05 | Discharge: 2024-04-05 | Disposition: A | Discharge status: Home / Self Care | Source: Ambulatory Visit | Attending: Internal Medicine | Admitting: Internal Medicine

## 2024-04-05 DIAGNOSIS — F17210 Nicotine dependence, cigarettes, uncomplicated: Secondary | ICD-10-CM | POA: Diagnosis not present

## 2024-04-05 DIAGNOSIS — Z122 Encounter for screening for malignant neoplasm of respiratory organs: Secondary | ICD-10-CM | POA: Insufficient documentation

## 2024-04-07 ENCOUNTER — Encounter: Payer: Self-pay | Admitting: Cardiovascular Disease

## 2024-04-07 ENCOUNTER — Other Ambulatory Visit: Payer: Self-pay

## 2024-05-17 ENCOUNTER — Other Ambulatory Visit: Payer: Self-pay

## 2024-05-17 MED ORDER — AMLODIPINE BESYLATE 10 MG PO TABS
10.0000 mg | ORAL_TABLET | Freq: Every day | ORAL | 1 refills | Status: AC
  Filled 2024-05-17: qty 90, 90d supply, fill #0

## 2024-05-17 MED FILL — Rosuvastatin Calcium Tab 40 MG: ORAL | 90 days supply | Qty: 90 | Fill #1 | Status: AC

## 2024-06-02 ENCOUNTER — Other Ambulatory Visit: Payer: Self-pay | Admitting: Obstetrics & Gynecology

## 2024-06-02 DIAGNOSIS — Z1231 Encounter for screening mammogram for malignant neoplasm of breast: Secondary | ICD-10-CM

## 2024-06-07 ENCOUNTER — Other Ambulatory Visit: Payer: Self-pay

## 2024-06-07 MED ORDER — FLUZONE 0.5 ML IM SUSY
0.5000 mL | PREFILLED_SYRINGE | Freq: Once | INTRAMUSCULAR | 0 refills | Status: AC
  Filled 2024-06-07: qty 0.5, 1d supply, fill #0
